# Patient Record
Sex: Male | Born: 1985 | Race: Black or African American | Hispanic: No | Marital: Married | Smoking: Never smoker
Health system: Southern US, Community
[De-identification: ages and names within clinical notes are randomized; demographics above are authoritative.]

---

## 2018-04-10 ENCOUNTER — Ambulatory Visit (HOSPITAL_COMMUNITY)
Admission: EM | Admit: 2018-04-10 | Discharge: 2018-04-10 | Disposition: A | Discharge status: Home / Self Care | Payer: Managed Care, Other (non HMO) | Attending: Family Medicine | Admitting: Family Medicine

## 2018-04-10 ENCOUNTER — Other Ambulatory Visit: Payer: Self-pay

## 2018-04-10 ENCOUNTER — Encounter (HOSPITAL_COMMUNITY): Payer: Self-pay | Admitting: *Deleted

## 2018-04-10 DIAGNOSIS — B349 Viral infection, unspecified: Secondary | ICD-10-CM | POA: Diagnosis not present

## 2018-04-10 MED ORDER — ONDANSETRON 4 MG PO TBDP: 4.0000 mg | ORAL_TABLET | Freq: Three times a day (TID) | ORAL | 0 refills | Status: AC | PRN

## 2018-04-10 MED ORDER — DIPHENHYDRAMINE HCL 25 MG PO TABS: 50.0000 mg | ORAL_TABLET | Freq: Every evening | ORAL | 0 refills | Status: AC | PRN

## 2018-04-10 NOTE — Discharge Instructions (Addendum)
No alarming signs on exam. Zofran as needed for nausea/vomiting. Benadryl as night to help with sleeping. You can take ibuprofen 800mg  before bedtime to prevent fever. Keep hydrated, your urine should be clear to pale yellow in color. Monitor for any worsening of symptoms, chest pain, shortness of breath, wheezing, swelling of the throat, night sweats, coughing up blood/vomiting up blood go to the emergency department for further evaluation needed.

## 2018-04-10 NOTE — ED Triage Notes (Signed)
C/O fever and cough x 1 wk; states fever has started to resolved, but still not feeling well.

## 2018-04-10 NOTE — ED Provider Notes (Signed)
[facility]    CSN: [id number] Arrival date & time: 04/10/18  1640     History   Chief Complaint Chief Complaint  Patient presents with  . Fever  . Cough    HPI Brian Oconnell is a 32 y.o. male.   32 year old male comes in for 1 week history of URI symptoms.  Has had cough, rhinorrhea, nasal congestion, body aches. Has had subjective fever without chills or night sweats.  Has had nausea with few episodes of vomiting.  States symptoms are improving, but still not feeling well.  Denies abdominal pain.  Has mild diarrhea.  Denies hemoptysis, hematemesis, melena, hematochezia.  OTC ibuprofen.  Never smoker.  States had gone to [place], returned 1 month ago.  Did take prophylactic treatment for malaria. States he is not able to sleep at night, but nothing particular is bothering him that causes this.      History reviewed. No pertinent past medical history.  There are no active problems to display for this patient.   History reviewed. No pertinent surgical history.     Home Medications    Prior to Admission medications   Medication Sig Start Date End Date Taking? Authorizing Provider  IBUPROFEN PO Take by mouth.   Yes [provider]  diphenhydrAMINE (BENADRYL) 25 MG tablet Take 2 tablets (50 mg total) by mouth at bedtime as needed for sleep. 04/10/18   Cathie HoopsYu, Amy V, PA-C  ondansetron (ZOFRAN ODT) 4 MG disintegrating tablet Take 1 tablet (4 mg total) by mouth every 8 (eight) hours as needed for nausea or vomiting. 04/10/18   Belinda FisherYu, Amy V, PA-C    Family History History reviewed. No pertinent family history.  Social History Social History   Tobacco Use  . Smoking status: Never Smoker  . Smokeless tobacco: Never Used  Substance Use Topics  . Alcohol use: Yes    Comment: 40oz beer daily  . Drug use: Never     Allergies   Patient has no known allergies.   Review of Systems Review of Systems  Reason unable to perform ROS: See HPI as above.      Physical Exam Triage Vital Signs ED Triage Vitals  Enc Vitals Group     BP 04/10/18 1724 135/76     Pulse Rate 04/10/18 1724 100     Resp 04/10/18 1724 18     Temp 04/10/18 1724 97.6 F (36.4 C)     Temp Source 04/10/18 1724 Oral     SpO2 04/10/18 1724 100 %     Weight --      Height --      Head Circumference --      Peak Flow --      Pain Score 04/10/18 1726 5     Pain Loc --      Pain Edu? --      Excl. in GC? --    No data found.  Updated Vital Signs BP 135/76   Pulse 100   Temp 97.6 F (36.4 C) (Oral)   Resp 18   SpO2 100%   Physical Exam  Constitutional: He is oriented to person, place, and time. He appears well-developed and well-nourished. No distress.  HENT:  Head: Normocephalic and atraumatic.  Right Ear: Tympanic membrane, external ear and ear canal normal. Tympanic membrane is not erythematous and not bulging.  Left Ear: Tympanic membrane, external ear and ear canal normal. Tympanic membrane is not erythematous and not bulging.  Nose: Nose normal. Right sinus  exhibits no maxillary sinus tenderness and no frontal sinus tenderness. Left sinus exhibits no maxillary sinus tenderness and no frontal sinus tenderness.  Mouth/Throat: Uvula is midline, oropharynx is clear and moist and mucous membranes are normal.  Eyes: Pupils are equal, round, and reactive to light. Conjunctivae are normal.  Neck: Normal range of motion. Neck supple.  Cardiovascular: Normal rate, regular rhythm and normal heart sounds. Exam reveals no gallop and no friction rub.  No murmur heard. Pulmonary/Chest: Effort normal and breath sounds normal. He has no decreased breath sounds. He has no wheezes. He has no rhonchi. He has no rales.  Abdominal: Soft. Bowel sounds are normal. There is no tenderness. There is no rebound and no guarding.  Lymphadenopathy:    He has no cervical adenopathy.  Neurological: He is alert and oriented to person, place, and time.  Skin: Skin is warm and dry.   Psychiatric: He has a normal mood and affect. His behavior is normal. Judgment normal.     UC Treatments / Results  Labs (all labs ordered are listed, but only abnormal results are displayed) Labs Reviewed - No data to display  EKG None  Radiology No results found.  Procedures Procedures (including critical care time)  Medications Ordered in UC Medications - No data to display  Initial Impression / Assessment and Plan / UC Course  I have reviewed the triage vital signs and the nursing notes.  Pertinent labs & imaging results that were available during my care of the patient were reviewed by me and considered in my medical decision making (see chart for details).    Discussed case with Dr Milus Glazier. Improving URI symptoms, will treat as viral illness. Symptomatic treatment as needed. Push fluids. Return precautions given.   Final Clinical Impressions(s) / UC Diagnoses   Final diagnoses:  Viral illness    ED Prescriptions    Medication Sig Dispense Auth. Provider   ondansetron (ZOFRAN ODT) 4 MG disintegrating tablet Take 1 tablet (4 mg total) by mouth every 8 (eight) hours as needed for nausea or vomiting. 10 tablet Yu, Amy V, PA-C   diphenhydrAMINE (BENADRYL) 25 MG tablet Take 2 tablets (50 mg total) by mouth at bedtime as needed for sleep. 30 tablet Threasa Alpha, [place] 04/10/18 1610

## 2018-04-11 ENCOUNTER — Emergency Department (HOSPITAL_COMMUNITY): Payer: Managed Care, Other (non HMO)

## 2018-04-11 ENCOUNTER — Encounter (HOSPITAL_COMMUNITY): Payer: Self-pay | Admitting: Emergency Medicine

## 2018-04-11 ENCOUNTER — Observation Stay (HOSPITAL_COMMUNITY)
Admission: EM | Admit: 2018-04-11 | Discharge: 2018-04-12 | Disposition: A | Discharge status: Home / Self Care | Payer: Managed Care, Other (non HMO) | Attending: Internal Medicine | Admitting: Internal Medicine

## 2018-04-11 DIAGNOSIS — F1010 Alcohol abuse, uncomplicated: Secondary | ICD-10-CM | POA: Diagnosis present

## 2018-04-11 DIAGNOSIS — T783XXA Angioneurotic edema, initial encounter: Secondary | ICD-10-CM | POA: Insufficient documentation

## 2018-04-11 DIAGNOSIS — T39315A Adverse effect of propionic acid derivatives, initial encounter: Secondary | ICD-10-CM

## 2018-04-11 DIAGNOSIS — Z791 Long term (current) use of non-steroidal anti-inflammatories (NSAID): Secondary | ICD-10-CM | POA: Diagnosis not present

## 2018-04-11 DIAGNOSIS — Z23 Encounter for immunization: Secondary | ICD-10-CM | POA: Diagnosis not present

## 2018-04-11 DIAGNOSIS — D6959 Other secondary thrombocytopenia: Secondary | ICD-10-CM | POA: Insufficient documentation

## 2018-04-11 DIAGNOSIS — Z7901 Long term (current) use of anticoagulants: Secondary | ICD-10-CM | POA: Insufficient documentation

## 2018-04-11 DIAGNOSIS — X58XXXA Exposure to other specified factors, initial encounter: Secondary | ICD-10-CM | POA: Diagnosis not present

## 2018-04-11 DIAGNOSIS — T7840XA Allergy, unspecified, initial encounter: Secondary | ICD-10-CM

## 2018-04-11 DIAGNOSIS — Z7189 Other specified counseling: Secondary | ICD-10-CM | POA: Diagnosis not present

## 2018-04-11 DIAGNOSIS — R4182 Altered mental status, unspecified: Secondary | ICD-10-CM | POA: Diagnosis present

## 2018-04-11 DIAGNOSIS — Z889 Allergy status to unspecified drugs, medicaments and biological substances status: Secondary | ICD-10-CM

## 2018-04-11 DIAGNOSIS — Z886 Allergy status to analgesic agent status: Secondary | ICD-10-CM | POA: Insufficient documentation

## 2018-04-11 DIAGNOSIS — Z7289 Other problems related to lifestyle: Secondary | ICD-10-CM | POA: Insufficient documentation

## 2018-04-11 DIAGNOSIS — D649 Anemia, unspecified: Secondary | ICD-10-CM | POA: Diagnosis not present

## 2018-04-11 DIAGNOSIS — B54 Unspecified malaria: Secondary | ICD-10-CM | POA: Diagnosis not present

## 2018-04-11 LAB — CBC WITH DIFFERENTIAL/PLATELET
BAND NEUTROPHILS: 0 %
BASOS PCT: 0 %
Band Neutrophils: 0 %
Basophils Absolute: 0 10*3/uL (ref 0.0–0.1)
Basophils Absolute: 0 10*3/uL (ref 0.0–0.1)
Basophils Relative: 0 %
Blasts: 0 %
Blasts: 0 %
EOS ABS: 0 10*3/uL (ref 0.0–0.5)
EOS PCT: 0 %
Eosinophils Absolute: 0 10*3/uL (ref 0.0–0.5)
Eosinophils Relative: 0 %
HCT: 39 % (ref 39.0–52.0)
HEMATOCRIT: 35.6 % — AB (ref 39.0–52.0)
HEMOGLOBIN: 11.8 g/dL — AB (ref 13.0–17.0)
Hemoglobin: 12.5 g/dL — ABNORMAL LOW (ref 13.0–17.0)
LYMPHS ABS: 1.5 10*3/uL (ref 0.7–4.0)
LYMPHS PCT: 18 %
LYMPHS PCT: 21 %
Lymphs Abs: 1.7 10*3/uL (ref 0.7–4.0)
MCH: 26.3 pg (ref 26.0–34.0)
MCH: 26.8 pg (ref 26.0–34.0)
MCHC: 32.1 g/dL (ref 30.0–36.0)
MCHC: 33.1 g/dL (ref 30.0–36.0)
MCV: 82.1 fL (ref 80.0–100.0)
MCV: 80.9 fL (ref 80.0–100.0)
MONO ABS: 1.6 10*3/uL — AB (ref 0.1–1.0)
MONOS PCT: 19 %
Metamyelocytes Relative: 0 %
Metamyelocytes Relative: 0 %
Monocytes Absolute: 1.4 10*3/uL — ABNORMAL HIGH (ref 0.1–1.0)
Monocytes Relative: 17 %
Myelocytes: 0 %
Myelocytes: 0 %
NEUTROS PCT: 62 %
NRBC: 0 /100{WBCs}
Neutro Abs: 5.4 10*3/uL (ref 1.7–7.7)
Neutro Abs: 5.1 10*3/uL (ref 1.7–7.7)
Neutrophils Relative %: 63 %
OTHER: 0 %
OTHER: 0 %
PLATELETS: UNDETERMINED 10*3/uL (ref 150–400)
PROMYELOCYTES RELATIVE: 0 %
Platelets: DECREASED 10*3/uL (ref 150–400)
Promyelocytes Relative: 0 %
RBC: 4.75 MIL/uL (ref 4.22–5.81)
RBC: 4.4 MIL/uL (ref 4.22–5.81)
RDW: 14.5 % (ref 11.5–15.5)
RDW: 14.4 % (ref 11.5–15.5)
WBC: 8.5 10*3/uL (ref 4.0–10.5)
WBC: 8.2 10*3/uL (ref 4.0–10.5)
nRBC: 0 % (ref 0.0–0.2)
nRBC: 0 /100 WBC

## 2018-04-11 LAB — COMPREHENSIVE METABOLIC PANEL
ALK PHOS: 39 U/L (ref 38–126)
ALT: 21 U/L (ref 0–44)
ALT: 18 U/L (ref 0–44)
ANION GAP: 13 (ref 5–15)
ANION GAP: 10 (ref 5–15)
AST: 46 U/L — ABNORMAL HIGH (ref 15–41)
AST: 34 U/L (ref 15–41)
Albumin: 3.1 g/dL — ABNORMAL LOW (ref 3.5–5.0)
Albumin: 2.7 g/dL — ABNORMAL LOW (ref 3.5–5.0)
Alkaline Phosphatase: 41 U/L (ref 38–126)
BILIRUBIN TOTAL: 2.1 mg/dL — AB (ref 0.3–1.2)
BUN: 24 mg/dL — ABNORMAL HIGH (ref 6–20)
BUN: 26 mg/dL — AB (ref 6–20)
CALCIUM: 8.4 mg/dL — AB (ref 8.9–10.3)
CHLORIDE: 98 mmol/L (ref 98–111)
CO2: 23 mmol/L (ref 22–32)
CO2: 24 mmol/L (ref 22–32)
CREATININE: 0.97 mg/dL (ref 0.61–1.24)
Calcium: 8.8 mg/dL — ABNORMAL LOW (ref 8.9–10.3)
Chloride: 101 mmol/L (ref 98–111)
Creatinine, Ser: 1.22 mg/dL (ref 0.61–1.24)
GFR calc Af Amer: >60 mL/min (ref >60)
GFR calc non Af Amer: >60 mL/min (ref >60)
GFR calc non Af Amer: >60 mL/min (ref >60)
GLUCOSE: 84 mg/dL (ref 70–99)
Glucose, Bld: 104 mg/dL — ABNORMAL HIGH (ref 70–99)
POTASSIUM: 3.5 mmol/L (ref 3.5–5.1)
Potassium: 3.6 mmol/L (ref 3.5–5.1)
SODIUM: 134 mmol/L — AB (ref 135–145)
Sodium: 135 mmol/L (ref 135–145)
TOTAL PROTEIN: 7 g/dL (ref 6.5–8.1)
Total Bilirubin: 3.2 mg/dL — ABNORMAL HIGH (ref 0.3–1.2)
Total Protein: 7.7 g/dL (ref 6.5–8.1)

## 2018-04-11 LAB — RAPID URINE DRUG SCREEN, HOSP PERFORMED
Amphetamines: NOT DETECTED
Barbiturates: NOT DETECTED
Benzodiazepines: NOT DETECTED
COCAINE: NOT DETECTED
OPIATES: NOT DETECTED
Tetrahydrocannabinol: POSITIVE — AB

## 2018-04-11 LAB — URINALYSIS, ROUTINE W REFLEX MICROSCOPIC
BILIRUBIN URINE: NEGATIVE
Glucose, UA: NEGATIVE mg/dL
Ketones, ur: 5 mg/dL — AB
Leukocytes, UA: NEGATIVE
Nitrite: NEGATIVE
PROTEIN: 100 mg/dL — AB
SPECIFIC GRAVITY, URINE: 1.029 (ref 1.005–1.030)
pH: 5 (ref 5.0–8.0)

## 2018-04-11 LAB — ETHANOL: Alcohol, Ethyl (B): <10 mg/dL (ref <10)

## 2018-04-11 LAB — PARASITE EXAM SCREEN, BLOOD-W CONF TO LABCORP (NOT @ ARMC)

## 2018-04-11 LAB — RAPID HIV SCREEN (HIV 1/2 AB+AG)
HIV 1/2 ANTIBODIES: NONREACTIVE
HIV-1 P24 ANTIGEN - HIV24: NONREACTIVE

## 2018-04-11 LAB — I-STAT CG4 LACTIC ACID, ED
Lactic Acid, Venous: 1.77 mmol/L (ref 0.5–1.9)
Lactic Acid, Venous: 1.25 mmol/L (ref 0.5–1.9)

## 2018-04-11 MED ORDER — SODIUM CHLORIDE 0.9 % IV BOLUS
1000.0000 mL | Freq: Once | INTRAVENOUS | Status: AC
  Administered 2018-04-11: 1000 mL via INTRAVENOUS

## 2018-04-11 MED ORDER — BISACODYL 5 MG PO TBEC: 5.0000 mg | DELAYED_RELEASE_TABLET | Freq: Every day | ORAL | Status: DC | PRN

## 2018-04-11 MED ORDER — THIAMINE HCL 100 MG/ML IJ SOLN
100.0000 mg | Freq: Every day | INTRAMUSCULAR | Status: DC
  Administered 2018-04-11 – 2018-04-12 (×2): 100 mg via INTRAVENOUS
  Filled 2018-04-11 (×2): qty 2

## 2018-04-11 MED ORDER — DIPHENHYDRAMINE HCL 50 MG/ML IJ SOLN
12.5000 mg | Freq: Once | INTRAMUSCULAR | Status: AC
  Administered 2018-04-11: 12.5 mg via INTRAVENOUS
  Filled 2018-04-11: qty 1

## 2018-04-11 MED ORDER — SODIUM CHLORIDE 0.9% FLUSH
3.0000 mL | Freq: Two times a day (BID) | INTRAVENOUS | Status: DC
  Administered 2018-04-11 – 2018-04-12 (×2): 3 mL via INTRAVENOUS

## 2018-04-11 MED ORDER — ACETAMINOPHEN 325 MG PO TABS: 650.0000 mg | ORAL_TABLET | Freq: Four times a day (QID) | ORAL | Status: DC | PRN

## 2018-04-11 MED ORDER — LACTATED RINGERS IV SOLN
INTRAVENOUS | Status: DC
  Administered 2018-04-11 – 2018-04-12 (×3): via INTRAVENOUS

## 2018-04-11 MED ORDER — ARTEMETHER-LUMEFANTRINE 20-120 MG PO TABS
4.0000 | ORAL_TABLET | Freq: Once | ORAL | Status: AC
  Administered 2018-04-11: 4 via ORAL
  Filled 2018-04-11 (×3): qty 24

## 2018-04-11 MED ORDER — ARTEMETHER-LUMEFANTRINE 20-120 MG PO TABS
4.0000 | ORAL_TABLET | Freq: Once | ORAL | Status: AC
  Administered 2018-04-11: 4 via ORAL
  Filled 2018-04-11: qty 24

## 2018-04-11 MED ORDER — ARTEMETHER-LUMEFANTRINE 20-120 MG PO TABS: 4.0000 | ORAL_TABLET | Freq: Two times a day (BID) | ORAL | Status: DC

## 2018-04-11 MED ORDER — METHYLPREDNISOLONE SODIUM SUCC 125 MG IJ SOLR
60.0000 mg | Freq: Once | INTRAMUSCULAR | Status: AC
  Administered 2018-04-11: 60 mg via INTRAVENOUS
  Filled 2018-04-11: qty 2

## 2018-04-11 MED ORDER — CHLORDIAZEPOXIDE HCL 5 MG PO CAPS
10.0000 mg | ORAL_CAPSULE | Freq: Three times a day (TID) | ORAL | Status: DC
  Administered 2018-04-11 – 2018-04-12 (×3): 10 mg via ORAL
  Filled 2018-04-11 (×3): qty 2

## 2018-04-11 MED ORDER — FOLIC ACID 1 MG PO TABS
1.0000 mg | ORAL_TABLET | Freq: Every day | ORAL | Status: DC
  Administered 2018-04-11 – 2018-04-12 (×2): 1 mg via ORAL
  Filled 2018-04-11 (×2): qty 1

## 2018-04-11 MED ORDER — LORAZEPAM 2 MG/ML IJ SOLN: 1.0000 mg | INTRAMUSCULAR | Status: DC | PRN

## 2018-04-11 MED ORDER — INFLUENZA VAC SPLIT QUAD 0.5 ML IM SUSY
0.5000 mL | PREFILLED_SYRINGE | INTRAMUSCULAR | Status: AC
  Administered 2018-04-12: 0.5 mL via INTRAMUSCULAR
  Filled 2018-04-11: qty 0.5

## 2018-04-11 MED ORDER — ONDANSETRON HCL 4 MG/2ML IJ SOLN: 4.0000 mg | Freq: Once | INTRAMUSCULAR | Status: DC

## 2018-04-11 MED ORDER — ACETAMINOPHEN 650 MG RE SUPP: 650.0000 mg | Freq: Four times a day (QID) | RECTAL | Status: DC | PRN

## 2018-04-11 MED ORDER — HEPARIN SODIUM (PORCINE) 5000 UNIT/ML IJ SOLN
5000.0000 [IU] | Freq: Three times a day (TID) | INTRAMUSCULAR | Status: DC
  Administered 2018-04-11 – 2018-04-12 (×2): 5000 [IU] via SUBCUTANEOUS
  Filled 2018-04-11 (×4): qty 1

## 2018-04-11 NOTE — Consult Note (Addendum)
[facility]  Total days of antibiotics        Reason for Consult: malaria   Referring Physician: ED  Active Problems:   * No active hospital problems. *    HPI: Brian Oconnell is a 32 y.o. male  Who is originally from [place], now resides in [place] for the last 5 years. He states that he has felt poorly, with night-time fevers for the past 7 days. He has recent returned from [place] roughly 4 wks ago visiting family. He states that he took anti-malarials that he acquired in [place] while he was there, but did not take any further medication upon returning to the states. He started to feel poorly with fevers roughly a week ago. He has been taking ibuprofen for discomfort. He went to Urgent care for evaluation on 11/24 and was dx with viral illness, due to complaint of fever, nausea, and cough. Today, EMS was brought in since his friend had found him unable to talk for unclear reason. In the ED, found to have some element of swollen tongue, unable to swallow saliva, thought to have drug reaction to ibuprofen- possible angioedema. His blood work was remarkable for hgb 12.5, plt were clumped but differential showed ring forms - roughly 2-3 rings/HPF per my inspection of smear. Cr mildly elevated at 1.25, and Tbili of 3.3. He is being admitted for monitoring/management of malaria and angioedema.  History reviewed. No pertinent past medical history.  Allergies: No Known Allergies  MEDICATIONS: . artemether-lumefantrine  4 tablet Oral Once  . artemether-lumefantrine  4 tablet Oral Once  . [START ON 04/12/2018] artemether-lumefantrine  4 tablet Oral BID    Social History   Tobacco Use  . Smoking status: Never Smoker  . Smokeless tobacco: Never Used  Substance Use Topics  . Alcohol use: Yes    Comment: 40oz beer daily  . Drug use: Never    Family history - family is in good health. Has sister in [place] and brother in [place], [employer] student Review of Systems  -   Constitutional: +for fever, chills, diaphoresis, activity change, appetite change, fatigue and unexpected weight change.  HENT: Negative for congestion, sore throat, rhinorrhea, sneezing, trouble swallowing and sinus pressure.  Eyes: Negative for photophobia and visual disturbance.  Respiratory: Negative for cough, chest tightness, shortness of breath, wheezing and stridor.  Cardiovascular: Negative for chest pain, palpitations and leg swelling.  Gastrointestinal: + for nausea, vomiting, abdominal pain, diarrhea, constipation, blood in stool, abdominal distention and anal bleeding.  Genitourinary: Negative for dysuria, hematuria, flank pain and difficulty urinating.  Musculoskeletal: Negative for myalgias, back pain, joint swelling, arthralgias and gait problem.  Skin: Negative for color change, pallor, rash and wound.  Neurological: Negative for dizziness, tremors, weakness and light-headedness.  Hematological: Negative for adenopathy. Does not bruise/bleed easily.  Psychiatric/Behavioral: Negative for behavioral problems, confusion, sleep disturbance, dysphoric mood, decreased concentration and agitation.     OBJECTIVE: Temp:  [97.6 F (36.4 C)-98.9 F (37.2 C)] 98.9 F (37.2 C) (11/25 1102) Pulse Rate:  [88-108] 88 (11/25 1200) Resp:  [18-25] 21 (11/25 1200) BP: (109-142)/(76-95) 109/86 (11/25 1200) SpO2:  [97 %-100 %] 99 % (11/25 1200) Physical Exam  Constitutional: He is oriented to person, place, and time. He appears well-developed and well-nourished. No distress.  HENT: mild scleral icterus Mouth/Throat: Oropharynx is clear and moist. No oropharyngeal exudate.  Cardiovascular: Normal rate, regular rhythm and normal heart sounds. Exam reveals no gallop and no friction rub.  No  murmur heard.  Pulmonary/Chest: Effort normal and breath sounds normal. No respiratory distress. He has no wheezes.  Abdominal: Soft. Bowel sounds are normal. He exhibits no distension. There is no  tenderness.  Lymphadenopathy:  He has no cervical adenopathy.  Neurological: He is alert and oriented to person, place, and time.  Skin: Skin is warm and dry. No rash noted. No erythema.  Psychiatric: initially difficult making eye contact. Tearful when he heard he had malaria. His behavior is normal.    LABS: Results for orders placed or performed during the hospital encounter of 04/11/18 (from the past 48 hour(s))  Comprehensive metabolic panel     Status: Abnormal   Collection Time: 04/11/18 11:35 AM  Result Value Ref Range   Sodium 134 (L) 135 - 145 mmol/L   Potassium 3.5 3.5 - 5.1 mmol/L   Chloride 98 98 - 111 mmol/L   CO2 23 22 - 32 mmol/L   Glucose, Bld 104 (H) 70 - 99 mg/dL   BUN 24 (H) 6 - 20 mg/dL   Creatinine, Ser 1.22 0.61 - 1.24 mg/dL   Calcium 8.8 (L) 8.9 - 10.3 mg/dL   Total Protein 7.7 6.5 - 8.1 g/dL   Albumin 3.1 (L) 3.5 - 5.0 g/dL   AST 46 (H) 15 - 41 U/L   ALT 21 0 - 44 U/L   Alkaline Phosphatase 41 38 - 126 U/L   Total Bilirubin 3.2 (H) 0.3 - 1.2 mg/dL   GFR calc non Af Amer >60 >60 mL/min   GFR calc Af Amer >60 >60 mL/min    Comment: (NOTE) The eGFR has been calculated using the CKD EPI equation. This calculation has not been validated in all clinical situations. eGFR's persistently <60 mL/min signify possible Chronic Kidney Disease.    Anion gap 13 5 - 15    Comment: Performed at [facility] [address], [place], [place] [address]  CBC with Differential     Status: Abnormal   Collection Time: 04/11/18 11:35 AM  Result Value Ref Range   WBC 8.5 4.0 - 10.5 K/uL   RBC 4.75 4.22 - 5.81 MIL/uL   Hemoglobin 12.5 (L) 13.0 - 17.0 g/dL   HCT 39.0 39.0 - 52.0 %   MCV 82.1 80.0 - 100.0 fL   MCH 26.3 26.0 - 34.0 pg   MCHC 32.1 30.0 - 36.0 g/dL   RDW 14.5 11.5 - 15.5 %   Platelets PLATELET CLUMPS NOTED ON SMEAR, UNABLE TO ESTIMATE 150 - 400 K/uL   Neutrophils Relative % 63 %   Lymphocytes Relative 18 %   Monocytes Relative 19 %   Eosinophils  Relative 0 %   Basophils Relative 0 %   Band Neutrophils 0 %   Metamyelocytes Relative 0 %   Myelocytes 0 %   Promyelocytes Relative 0 %   Blasts 0 %   nRBC 0 0 /100 WBC   Other 0 %   Neutro Abs 5.4 1.7 - 7.7 K/uL   Lymphs Abs 1.5 0.7 - 4.0 K/uL   Monocytes Absolute 1.6 (H) 0.1 - 1.0 K/uL   Eosinophils Absolute 0.0 0.0 - 0.5 K/uL   Basophils Absolute 0.0 0.0 - 0.1 K/uL   RBC Morphology MALARIA NOTED ON SMEAR     Comment: Preliminary result given to Olivia Canter PA 04/11/2018 1253 BY A BENNETT Performed at [facility], [place] [address] [place], [place] [address]   Ethanol     Status: None   Collection Time:  04/11/18 11:35 AM  Result Value Ref Range   Alcohol, Ethyl (B) <10 <10 mg/dL    Comment: (NOTE) Lowest detectable limit for serum alcohol is 10 mg/dL. For medical purposes only. Performed at [facility], [place] [address], [place], [place] [address]   I-Stat CG4 Lactic Acid, ED     Status: None   Collection Time: 04/11/18 11:41 AM  Result Value Ref Range   Lactic Acid, Venous 1.77 0.5 - 1.9 mmol/L    MICRO: reviewed IMAGING: Dg Chest 2 View  Result Date: 04/11/2018 CLINICAL DATA:  Cough for a week. EXAM: CHEST - 2 VIEW COMPARISON:  None. FINDINGS: Heart size and mediastinal contours are within normal limits. Lungs are clear. No pleural effusion or pneumothorax seen. Osseous structures about the chest are unremarkable. IMPRESSION: No active cardiopulmonary disease. No evidence of pneumonia or pulmonary edema. Electronically Signed   By: Franki Cabot M.D.   On: 04/11/2018 12:33   Ct Head Wo Contrast  Result Date: 04/11/2018 CLINICAL DATA:  Altered level of consciousness EXAM: CT HEAD WITHOUT CONTRAST TECHNIQUE: Contiguous axial images were obtained from the base of the skull through the vertex without intravenous contrast. COMPARISON:  None. FINDINGS: Brain: No evidence of acute infarction, hemorrhage, hydrocephalus, extra-axial collection or mass lesion/mass  effect. Vascular: Negative for hyperdense vessel Skull: Negative Sinuses/Orbits: Bubbly secretions right maxillary sinus. Remaining sinuses clear. Normal orbit. Other: None IMPRESSION: Negative CT brain Bubbly secretions right maxillary sinus. Electronically Signed   By: Franchot Gallo M.D.   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 Will contact CDC to see if can get artesunate, IV formulation of anti-malarials due to aki,6-7% parasitemia and AMS. - Please send thick and thin smears for evaluation and ID - will repeat cbc and cmp tonite to see if there is further hemolysis - Will need to be admitted for observation to monitor for any signs of worsening anemia or mental status changes - Please check hiv  Angioedema to nsaids = pt getting benadryl and dose of solumedrol.  If AMS = still persists tomorrow or his AMS worsens, would get mri to see if having infarcts associated with malaria  He reports his sister is taking a bus down from [place] to be with him.  Spent 110 min with patient >50% in coordination of care to treat malaria/access to medications

## 2018-04-11 NOTE — ED Notes (Signed)
Patient transported to CT 

## 2018-04-11 NOTE — ED Provider Notes (Signed)
[facility] EMERGENCY DEPARTMENT Provider Note   CSN: [id number] Arrival date & time: 04/11/18  1056     History   Chief Complaint Chief Complaint  Patient presents with  . Altered Mental Status    HPI Brian Oconnell is a 32 y.o. male.  31yo male brought in by EMS for evaluation. Patient is awake and alert, follows simple commands but will not talk at this time. Patient's friend on the phone states he called to check on him today and the patient answered the phone but would not talk which prompted the friend to call 911 for him. Friend states patient is from [place], [place], went back to [place] to visit and returned a month ago, patient has not felt well recently and went to UC last night. Review of UC note, patient reported he had 1 week of cough, runny nose, congestion, body aches, nausea/vomiting/diarrhea, subjective fever without chills or sweats. UC record reports patient took antimalarial drugs while out of the country. Unknown vaccine status.      History reviewed. No pertinent past medical history.  There are no active problems to display for this patient.   History reviewed. No pertinent surgical history.      Home Medications    Prior to Admission medications   Medication Sig Start Date End Date Taking? Authorizing Provider  diphenhydrAMINE (BENADRYL) 25 MG tablet Take 2 tablets (50 mg total) by mouth at bedtime as needed for sleep. 04/10/18   Cathie Hoops, Amy V, PA-C  IBUPROFEN PO Take by mouth.    [provider]  ondansetron (ZOFRAN ODT) 4 MG disintegrating tablet Take 1 tablet (4 mg total) by mouth every 8 (eight) hours as needed for nausea or vomiting. 04/10/18   Belinda Fisher, PA-C    Family History History reviewed. No pertinent family history.  Social History Social History   Tobacco Use  . Smoking status: Never Smoker  . Smokeless tobacco: Never Used  Substance Use Topics  . Alcohol use: Yes   Comment: 40oz beer daily  . Drug use: Never     Allergies   Patient has no known allergies.   Review of Systems Review of Systems  Unable to perform ROS: Patient nonverbal     Physical Exam Updated Vital Signs BP 109/86   Pulse 88   Temp 98.9 F (37.2 C) (Oral)   Resp (!) 21   SpO2 99%   Physical Exam  Constitutional: He appears well-developed and well-nourished. No distress.  HENT:  Head: Normocephalic and atraumatic.  Right Ear: External ear normal.  Left Ear: External ear normal.  Nose: Nose normal.  Mouth/Throat: Oropharynx is clear and moist and mucous membranes are normal. No trismus in the jaw. No uvula swelling. No oropharyngeal exudate. Tonsils are 2+ on the right. Tonsils are 1+ on the left. No tonsillar exudate.  Patient is hesitant to open mouth wide for exam, when given suction catheter he is able to fully open his mouth, oropharynx visualized with tongue depressor, no edema, no trismus  Eyes: Conjunctivae are normal.  Neck: Neck supple.  Cardiovascular: Regular rhythm, normal heart sounds and intact distal pulses. Tachycardia present.  No murmur heard. Pulmonary/Chest: Effort normal and breath sounds normal. No respiratory distress. He exhibits no tenderness.  Abdominal: Soft. There is no tenderness.  Musculoskeletal: He exhibits no tenderness.  Lymphadenopathy:    He has no cervical adenopathy.  Neurological: He is alert. GCS eye subscore is 4.  GCS verbal subscore is 1. GCS motor subscore is 6.  Alert, follows commands, non verbal  Skin: Skin is warm. No rash noted. He is diaphoretic.     ED Treatments / Results  Labs (all labs ordered are listed, but only abnormal results are displayed) Labs Reviewed  COMPREHENSIVE METABOLIC PANEL - Abnormal; Notable for the following components:      Result Value   Sodium 134 (*)    Glucose, Bld 104 (*)    BUN 24 (*)    Calcium 8.8 (*)    Albumin 3.1 (*)    AST 46 (*)    Total Bilirubin 3.2 (*)    All  other components within normal limits  CBC WITH DIFFERENTIAL/PLATELET - Abnormal; Notable for the following components:   Hemoglobin 12.5 (*)    Monocytes Absolute 1.6 (*)    All other components within normal limits  ETHANOL  URINALYSIS, ROUTINE W REFLEX MICROSCOPIC  RAPID URINE DRUG SCREEN, HOSP PERFORMED  PARASITE EXAM SCREEN, BLOOD-W CONF TO [facility] (NOT @ [facility])  RAPID HIV SCREEN (HIV 1/2 AB+AG)  I-STAT CG4 LACTIC ACID, ED  I-STAT CG4 LACTIC ACID, ED    EKG None  Radiology Dg Chest 2 View  Result Date: 04/11/2018 CLINICAL DATA:  Cough for a week. EXAM: CHEST - 2 VIEW COMPARISON:  None. FINDINGS: Heart size and mediastinal contours are within normal limits. Lungs are clear. No pleural effusion or pneumothorax seen. Osseous structures about the chest are unremarkable. IMPRESSION: No active cardiopulmonary disease. No evidence of pneumonia or pulmonary edema. Electronically Signed   By: Bary RichardStan  Maynard M.D.   On: 04/11/2018 12:33   Ct Head Wo Contrast  Result Date: 04/11/2018 CLINICAL DATA:  Altered level of consciousness EXAM: CT HEAD WITHOUT CONTRAST TECHNIQUE: Contiguous axial images were obtained from the base of the skull through the vertex without intravenous contrast. COMPARISON:  None. FINDINGS: Brain: No evidence of acute infarction, hemorrhage, hydrocephalus, extra-axial collection or mass lesion/mass effect. Vascular: Negative for hyperdense vessel Skull: Negative Sinuses/Orbits: Bubbly secretions right maxillary sinus. Remaining sinuses clear. Normal orbit. Other: None IMPRESSION: Negative CT brain Bubbly secretions right maxillary sinus. Electronically Signed   By: Marlan Palauharles  Clark M.D.   On: 04/11/2018 13:03    Procedures Procedures (including critical care time)  Medications Ordered in ED Medications  artemether-lumefantrine (COARTEM) 20-120 MG tablet 4 tablet (has no administration in time range)  artemether-lumefantrine (COARTEM) 20-120 MG tablet 4 tablet (has no  administration in time range)  ondansetron (ZOFRAN) injection 4 mg (has no administration in time range)  sodium chloride 0.9 % bolus 1,000 mL (0 mLs Intravenous Stopped 04/11/18 1307)  artemether-lumefantrine (COARTEM) 20-120 MG tablet 4 tablet (4 tablets Oral Given 04/11/18 1429)     Initial Impression / Assessment and Plan / ED Course  I have reviewed the triage vital signs and the nursing notes.  Pertinent labs & imaging results that were available during my care of the patient were reviewed by me and considered in my medical decision making (see chart for details).  Clinical Course as of Apr 12 1547  Mon Apr 11, 2018  1305 31yo male brought in by EMS from home, per UC note- patient seen last night for flu like illness x 1 week, returned from [place] 1 month ago and had reported taking antimalaria meds. Patient non verbal today, alert, smiles and nods his head, does not answer questions but does follow commands. Patient holding emesis bag, hesitant to open his mouth initially but able to  with suction and tongue depressor exam. Patient's friend on speaker phone, states patient does speak and understand [place]. Call from lab, ring form blood parasites, requests blood parasite screen. Case discussed with Dr. Drue Second, Infectious Disease, will see the patient.    [LM]  1523 Dr. Drue Second, Id, has seen the patient, requests hospitalist admit, 24 hour observation may be appropriate.    [LM]  1548 Discussed with hospitalist who will see the patient.   [LM]    Clinical Course User Index [LM] Jeannie Fend, PA-C   Final Clinical Impressions(s) / ED Diagnoses   Final diagnoses:  Malaria    ED Discharge Orders    None       Alden Hipp 04/11/18 1548    Loren Racer, MD 04/12/18 2258

## 2018-04-11 NOTE — ED Triage Notes (Signed)
Pt here from home with c/o sore throat , not talking , pt was seen yesterday and given scripts for a viral illness

## 2018-04-11 NOTE — H&P (Addendum)
TRH H&P   Patient Demographics:    Brian Oconnell, is a 32 y.o. male  MRN: [id number]   DOB - 12-12-1985  Admit Date - 04/11/2018  Outpatient Primary MD for the patient is Patient, No Pcp Per      Patient coming from: Home  Chief Complaint  Patient presents with  . Altered Mental Status      HPI:    Brian Oconnell  is a 32 y.o. male, who was originally from [place], medical history except alcohol consumption which is pretty heavy last drink 7 days ago, patient recently visited [place] about 5 weeks ago, came back and was feeling well however up for the last 7 to 8 days he has been not feeling well with mild generalized body aches, generalized weakness, he has not had any alcohol in 7 days, went to an urgent care where he was given ibuprofen and Benadryl, he was taking plenty of ibuprofen to feel better however for the last 14 to 15 hours he started experiencing some tongue swelling and was unable to talk properly.    He then came to the ER where work-up suggested that he possibly had a malarial infection with mild allergic reaction to NSAIDs causing early angioedema.  He had thankfully taken some Benadryl and his swelling is already going down.  He was seen by ID and I was requested to admit for further malaria treatment.    Review of systems:    A full 10 point Review of Systems was done, except as stated above, all other Review of Systems were negative.   With Past History of the following :    History reviewed. No pertinent past medical history.    History reviewed. No pertinent surgical history.    Social History:     Social History   Tobacco Use  . Smoking status: Never Smoker  . Smokeless  tobacco: Never Used  Substance Use Topics  . Alcohol use: Yes    Comment: 40oz beer daily         Family History :   History of bone marrow issues or problems.   Home Medications:   Prior to Admission medications   Medication Sig Start Date End Date Taking? Authorizing Provider  diphenhydrAMINE (BENADRYL) 25 MG tablet Take 2 tablets (50 mg total) by mouth at bedtime as  needed for sleep. 04/10/18   Cathie HoopsYu, Amy V, PA-C  IBUPROFEN PO Take by mouth.    [provider]  ondansetron (ZOFRAN ODT) 4 MG disintegrating tablet Take 1 tablet (4 mg total) by mouth every 8 (eight) hours as needed for nausea or vomiting. 04/10/18   Belinda FisherYu, Amy V, PA-C     Allergies:    No Known Allergies   Physical Exam:   Vitals  Blood pressure 109/86, pulse 88, temperature 98.9 F (37.2 C), temperature source Oral, resp. rate (!) 21, SpO2 99 %.   1. General Young African-American male lying in hospital bed in no apparent discomfort,  2. Normal affect and insight, Not Suicidal or Homicidal, Awake Alert, Oriented X 3.  3. No F.N deficits, ALL C.Nerves Intact, Strength 5/5 all 4 extremities, Sensation intact all 4 extremities, Plantars down going.  4. Ears and Eyes appear Normal, Conjunctivae clear, PERRLA. Moist Oral Mucosa.  Tongue size now appears to be normal,  5. Supple Neck, No JVD, No cervical lymphadenopathy appriciated, No Carotid Bruits.  6. Symmetrical Chest wall movement, Good air movement bilaterally, CTAB.  7. RRR, No Gallops, Rubs or Murmurs, No Parasternal Heave.  8. Positive Bowel Sounds, Abdomen Soft, No tenderness, No organomegaly appriciated,No rebound -guarding or rigidity.  9.  No Cyanosis, Normal Skin Turgor, No Skin Rash or Bruise.  10. Good muscle tone,  joints appear normal , no effusions, Normal ROM.  11. No Palpable Lymph Nodes in Neck or Axillae     Data Review:    CBC Recent Labs  Lab 04/11/18 1135  WBC 8.5  HGB 12.5*  HCT 39.0  PLT PLATELET CLUMPS  NOTED ON SMEAR, UNABLE TO ESTIMATE  MCV 82.1  MCH 26.3  MCHC 32.1  RDW 14.5  LYMPHSABS 1.5  MONOABS 1.6*  EOSABS 0.0  BASOSABS 0.0   ------------------------------------------------------------------------------------------------------------------  Chemistries  Recent Labs  Lab 04/11/18 1135  NA 134*  K 3.5  CL 98  CO2 23  GLUCOSE 104*  BUN 24*  CREATININE 1.22  CALCIUM 8.8*  AST 46*  ALT 21  ALKPHOS 41  BILITOT 3.2*   ------------------------------------------------------------------------------------------------------------------ CrCl cannot be calculated (Unknown ideal weight.). ------------------------------------------------------------------------------------------------------------------ No results for input(s): TSH, T4TOTAL, T3FREE, THYROIDAB in the last 72 hours.  Invalid input(s): FREET3  Coagulation profile No results for input(s): INR, PROTIME in the last 168 hours. ------------------------------------------------------------------------------------------------------------------- No results for input(s): DDIMER in the last 72 hours. -------------------------------------------------------------------------------------------------------------------  Cardiac Enzymes No results for input(s): CKMB, TROPONINI, MYOGLOBIN in the last 168 hours.  Invalid input(s): CK ------------------------------------------------------------------------------------------------------------------ No results found for: BNP   ---------------------------------------------------------------------------------------------------------------  Urinalysis No results found for: COLORURINE, APPEARANCEUR, LABSPEC, PHURINE, GLUCOSEU, HGBUR, BILIRUBINUR, KETONESUR, PROTEINUR, UROBILINOGEN, NITRITE, LEUKOCYTESUR  ----------------------------------------------------------------------------------------------------------------   Imaging Results:    Dg Chest 2 View  Result Date:  04/11/2018 CLINICAL DATA:  Cough for a week. EXAM: CHEST - 2 VIEW COMPARISON:  None. FINDINGS: Heart size and mediastinal contours are within normal limits. Lungs are clear. No pleural effusion or pneumothorax seen. Osseous structures about the chest are unremarkable. IMPRESSION: No active cardiopulmonary disease. No evidence of pneumonia or pulmonary edema. Electronically Signed   By: Bary RichardStan  Maynard M.D.   On: 04/11/2018 12:33   Ct Head Wo Contrast  Result Date: 04/11/2018 CLINICAL DATA:  Altered level of consciousness EXAM: CT HEAD WITHOUT CONTRAST TECHNIQUE: Contiguous axial images were obtained from the base of the skull through the vertex without intravenous contrast. COMPARISON:  None. FINDINGS: Brain: No evidence of acute infarction, hemorrhage, hydrocephalus, extra-axial  collection or mass lesion/mass effect. Vascular: Negative for hyperdense vessel Skull: Negative Sinuses/Orbits: Bubbly secretions right maxillary sinus. Remaining sinuses clear. Normal orbit. Other: None IMPRESSION: Negative CT brain Bubbly secretions right maxillary sinus. Electronically Signed   By: Marlan Palau M.D.   On: 04/11/2018 13:03        Assessment & Plan:      1.  Acute malaria suspicious for falciparum.  Seen by ID, will be placed on Coartem, IVF for hydration monitor CBC, use Tylenol as needed avoid NSAIDs for possible allergy.  2.  Angioedema likely allergic to NSAIDs.  Much improved and almost resolved, 1 dose of IV Benadryl and Solu-Medrol, monitor with supportive care.  3.  Counseled to quit, last drink 7 days ago, placed on folic acid and thiamine, low-dose scheduled Librium.  Monitor on IV Ativan as needed.  Should not go in DTs as last drink was 7 days ago.     DVT Prophylaxis Heparin    AM Labs Ordered, also please review Full Orders  Family Communication: Admission, patients condition and plan of care including tests being ordered have been discussed with the patient who indicates  understanding and agree with the plan and Code Status.  Code Status Full  Likely DC to  Home 1-2 days  Condition GUARDED    Consults called: ID    Admission status: Inpt    Time spent in minutes : 35   Susa Raring M.D on 04/11/2018 at 4:03 PM  To page go to [email or web address] - password [facility]

## 2018-04-12 ENCOUNTER — Other Ambulatory Visit: Payer: Self-pay

## 2018-04-12 DIAGNOSIS — N189 Chronic kidney disease, unspecified: Secondary | ICD-10-CM | POA: Diagnosis not present

## 2018-04-12 DIAGNOSIS — F1010 Alcohol abuse, uncomplicated: Secondary | ICD-10-CM | POA: Diagnosis present

## 2018-04-12 DIAGNOSIS — D649 Anemia, unspecified: Secondary | ICD-10-CM | POA: Diagnosis present

## 2018-04-12 DIAGNOSIS — T39315D Adverse effect of propionic acid derivatives, subsequent encounter: Secondary | ICD-10-CM | POA: Diagnosis not present

## 2018-04-12 DIAGNOSIS — B509 Plasmodium falciparum malaria, unspecified: Secondary | ICD-10-CM | POA: Diagnosis not present

## 2018-04-12 DIAGNOSIS — D631 Anemia in chronic kidney disease: Secondary | ICD-10-CM

## 2018-04-12 DIAGNOSIS — Z889 Allergy status to unspecified drugs, medicaments and biological substances status: Secondary | ICD-10-CM | POA: Diagnosis not present

## 2018-04-12 DIAGNOSIS — T783XXD Angioneurotic edema, subsequent encounter: Secondary | ICD-10-CM

## 2018-04-12 DIAGNOSIS — N179 Acute kidney failure, unspecified: Secondary | ICD-10-CM

## 2018-04-12 DIAGNOSIS — B54 Unspecified malaria: Secondary | ICD-10-CM | POA: Diagnosis not present

## 2018-04-12 LAB — CBC
HEMATOCRIT: 32 % — AB (ref 39.0–52.0)
Hemoglobin: 10.4 g/dL — ABNORMAL LOW (ref 13.0–17.0)
MCH: 26.5 pg (ref 26.0–34.0)
MCHC: 32.5 g/dL (ref 30.0–36.0)
MCV: 81.6 fL (ref 80.0–100.0)
Platelets: 92 10*3/uL — ABNORMAL LOW (ref 150–400)
RBC: 3.92 MIL/uL — ABNORMAL LOW (ref 4.22–5.81)
RDW: 14.7 % (ref 11.5–15.5)
WBC: 5.5 10*3/uL (ref 4.0–10.5)
nRBC: 0 % (ref 0.0–0.2)

## 2018-04-12 LAB — TECHNOLOGIST SMEAR REVIEW: Tech Review: 34

## 2018-04-12 LAB — COMPREHENSIVE METABOLIC PANEL
ALK PHOS: 33 U/L — AB (ref 38–126)
ALT: 18 U/L (ref 0–44)
AST: 31 U/L (ref 15–41)
Albumin: 2.5 g/dL — ABNORMAL LOW (ref 3.5–5.0)
Anion gap: 8 (ref 5–15)
BILIRUBIN TOTAL: 1.3 mg/dL — AB (ref 0.3–1.2)
BUN: 26 mg/dL — AB (ref 6–20)
CO2: 25 mmol/L (ref 22–32)
Calcium: 8.6 mg/dL — ABNORMAL LOW (ref 8.9–10.3)
Chloride: 103 mmol/L (ref 98–111)
Creatinine, Ser: 0.94 mg/dL (ref 0.61–1.24)
GFR calc Af Amer: >60 mL/min (ref >60)
Glucose, Bld: 106 mg/dL — ABNORMAL HIGH (ref 70–99)
Potassium: 4.3 mmol/L (ref 3.5–5.1)
Sodium: 136 mmol/L (ref 135–145)
Total Protein: 6.4 g/dL — ABNORMAL LOW (ref 6.5–8.1)

## 2018-04-12 LAB — PATHOLOGIST SMEAR REVIEW

## 2018-04-12 LAB — MAGNESIUM: MAGNESIUM: 2 mg/dL (ref 1.7–2.4)

## 2018-04-12 MED ORDER — ATOVAQUONE-PROGUANIL HCL 250-100 MG PO TABS: 4.0000 | ORAL_TABLET | Freq: Every day | ORAL | 0 refills | Status: AC

## 2018-04-12 MED ORDER — ARTESUNATE 10 MG/ML IJ SOLN - FOR COMPASSIONATE USE (CDC SUPPLY)
2.4000 mg/kg | Freq: Two times a day (BID) | INTRAMUSCULAR | Status: DC
  Filled 2018-04-12 (×2): qty 17.4

## 2018-04-12 MED ORDER — VITAMIN B-1 100 MG PO TABS: 100.0000 mg | ORAL_TABLET | Freq: Every day | ORAL | Status: DC

## 2018-04-12 MED ORDER — ARTESUNATE 10 MG/ML IJ SOLN - FOR COMPASSIONATE USE (CDC SUPPLY): 180.0000 mg | Freq: Once | INTRAMUSCULAR | Status: DC

## 2018-04-12 MED ORDER — ARTEMETHER-LUMEFANTRINE 20-120 MG PO TABS: 4.0000 | ORAL_TABLET | Freq: Two times a day (BID) | ORAL | 0 refills | Status: DC

## 2018-04-12 MED ORDER — ARTESUNATE 10 MG/ML IJ SOLN - FOR COMPASSIONATE USE (CDC SUPPLY): 2.4000 mg/kg | Freq: Once | INTRAMUSCULAR | Status: DC

## 2018-04-12 MED ORDER — ARTESUNATE 10 MG/ML IJ SOLN - FOR COMPASSIONATE USE (CDC SUPPLY)
180.0000 mg | Freq: Two times a day (BID) | INTRAMUSCULAR | Status: DC
  Administered 2018-04-12 (×2): 180 mg via INTRAVENOUS
  Filled 2018-04-12 (×3): qty 18

## 2018-04-12 MED ORDER — ARTEMETHER-LUMEFANTRINE 20-120 MG PO TABS: ORAL_TABLET | ORAL | 0 refills | Status: DC

## 2018-04-12 MED FILL — ATOVAQUONE-PROGUANIL 250-10: 250-100 | 3 days supply | Qty: 12 | Fill #0

## 2018-04-12 NOTE — Progress Notes (Signed)
Jefferey Rae to be D/C'd Home per MD order.  Discussed with the patient and all questions fully answered.  VSS, Skin clean, dry and intact without evidence of skin break down, no evidence of skin tears noted. IV catheter discontinued intact. Site without signs and symptoms of complications. Dressing and pressure applied.  An After Visit Summary was printed and given to the patient. [facility].  D/c education completed with patient/family including follow up instructions, medication list, d/c activities limitations if indicated, with other d/c instructions as indicated by MD - patient able to verbalize understanding, all questions fully answered.   Patient instructed to return to ED, call 911, or call MD for any changes in condition.   Patient escorted off unit with family member and D/C home via private auto.  Garry HeaterDannon D Ladaija Dimino 04/12/2018 7:42 PM

## 2018-04-12 NOTE — Progress Notes (Signed)
PROGRESS NOTE                                                                                                                                                                                                             Patient Demographics:    Brian Oconnell, is a 32 y.o. male, DOB - May 07, 1986, ZHY:[id number]  Admit date - 04/11/2018   Admitting Physician Leroy Sea, MD  Outpatient Primary MD for the patient is Patient, No Pcp Per  LOS - 1  Chief Complaint  Patient presents with  . Altered Mental Status       Brief Narrative     Brian Oconnell  is a 32 y.o. male, who was originally from [place], medical history except alcohol consumption which is pretty heavy last drink 7 days ago, patient recently visited [place] about 5 weeks ago, came back and was feeling well however up for the last 7 to 8 days he has been not feeling well with mild generalized body aches, generalized weakness, he has not had any alcohol in 7 days, went to an urgent care where he was given ibuprofen and Benadryl, he was taking plenty of ibuprofen to feel better however for the last 14 to 15 hours he started experiencing some tongue swelling and was unable to talk properly.    He then came to the ER where work-up suggested that he possibly had a malarial infection with mild allergic reaction to NSAIDs causing early angioedema.  He had thankfully taken some Benadryl and his swelling is already going down.  He was seen by ID and I was requested to admit for further malaria treatment.    Subjective:    Brian Oconnell today has, No headache, No chest pain, No abdominal pain - No Nausea, No new weakness tingling or numbness, No Cough - SOB.     Assessment  & Plan :      1.  Acute malaria suspicious for falciparum.  placed on Artesunate by ID, clinically stable and feels better.  2.  Angioedema likely allergic to NSAIDs.  resolved after IV Solumedrol and Benadryl.  3.  Counseled to quit, last  drink 7 days ago, placed on folic acid and thiamine, low-dose scheduled Librium.  Monitor on IV Ativan as needed.  Should not go in DTs as last drink was 7 days ago.  4. Anemia and Thrombocytopenia - due to malaria, monitor.    Family Communication  :  Sister  Code Status :  Full  Disposition Plan  :  Home in 1-2 days  Consults  :  ID  Procedures  :    CT Head - negative  DVT Prophylaxis  :  Heparin    Lab Results  Component Value Date   PLT 92 (L) 04/12/2018    Diet :  Diet Order            Diet regular Room service appropriate? Yes; Fluid consistency: Thin  Diet effective now               Inpatient Medications Scheduled Meds: . artesunate (for compassionate use)  180 mg Intravenous Q12H   Followed by  . [START ON 04/14/2018] artesunate (for compassionate use)  180 mg Intravenous Once  . chlordiazePOXIDE  10 mg Oral TID  . folic acid  1 mg Oral Daily  . heparin  5,000 Units Subcutaneous Q8H  . Influenza vac split quadrivalent PF  0.5 mL Intramuscular Tomorrow-1000  . ondansetron (ZOFRAN) IV  4 mg Intravenous Once  . sodium chloride flush  3 mL Intravenous Q12H  . thiamine injection  100 mg Intravenous Daily   Continuous Infusions:  PRN Meds:.acetaminophen **OR** acetaminophen, bisacodyl, LORazepam  Antibiotics  :   Anti-infectives (From admission, onward)   Start     Dose/Rate Route Frequency Ordered Stop   04/14/18 0200  artesunate (for compassionate use) 10 MG/ML IV syringe 174 mg  Status:  Discontinued     2.4 mg/kg  72.5 kg Intravenous Once 04/12/18 0036 04/12/18 0101   04/14/18 0200  artesunate (for compassionate use) 10 MG/ML IV syringe 180 mg     180 mg Intravenous Once 04/12/18 0101     04/12/18 1000  artemether-lumefantrine (COARTEM) 20-120 MG tablet 4 tablet  Status:  Discontinued     4 tablet Oral 2 times daily 04/11/18 1321 04/12/18 0055   04/12/18 0200  artesunate (for compassionate use) 10 MG/ML IV syringe 174 mg  Status:  Discontinued       2.4 mg/kg  72.5 kg Intravenous Every 12 hours 04/12/18 0036 04/12/18 0101   04/12/18 0200  artesunate (for compassionate use) 10 MG/ML IV syringe 180 mg     180 mg Intravenous Every 12 hours 04/12/18 0101 04/13/18 1359   04/11/18 2030  artemether-lumefantrine (COARTEM) 20-120 MG tablet 4 tablet     4 tablet Oral  Once 04/11/18 1320 04/11/18 2242   04/11/18 1330  artemether-lumefantrine (COARTEM) 20-120 MG tablet 4 tablet     4 tablet Oral  Once 04/11/18 1319 04/11/18 1429          Objective:   Vitals:   04/11/18 1200 04/11/18 1619 04/11/18 1643 04/11/18 2137  BP: 109/86 126/80 115/71 116/81  Pulse: 88 88 83 80  Resp: (!) 21 16 19 18   Temp:   98.6 F (37 C) 98.1 F (36.7 C)  TempSrc:   Oral Oral  SpO2: 99% 100% 100% 99%  Weight:   72.5 kg   Height:   6' (1.829 m)     Wt Readings from Last 3 Encounters:  04/11/18 72.5 kg     Intake/Output Summary (Last 24 hours) at 04/12/2018 0950 Last data filed at 04/12/2018 0658 Gross per 24 hour  Intake 1589.77 ml  Output 300 ml  Net 1289.77 ml     Physical Exam  Awake Alert, Oriented X 3, No new F.N deficits, Normal affect Brian Oconnell,PERRAL Supple Neck,No JVD, No cervical lymphadenopathy appriciated.  Symmetrical Chest wall movement, Good air movement bilaterally, CTAB RRR,No Gallops,Rubs or  new Murmurs, No Parasternal Heave +ve B.Sounds, Abd Soft, No tenderness, No organomegaly appriciated, No rebound - guarding or rigidity. No Cyanosis, Clubbing or edema, No new Rash or bruise       Data Review:    CBC Recent Labs  Lab 04/11/18 1135 04/11/18 1913 04/12/18 0501  WBC 8.5 8.2 5.5  HGB 12.5* 11.8* 10.4*  HCT 39.0 35.6* 32.0*  PLT PLATELET CLUMPS NOTED ON SMEAR, UNABLE TO ESTIMATE PLATELET CLUMPS NOTED ON SMEAR, COUNT APPEARS DECREASED 92*  MCV 82.1 80.9 81.6  MCH 26.3 26.8 26.5  MCHC 32.1 33.1 32.5  RDW 14.5 14.4 14.7  LYMPHSABS 1.5 1.7  --   MONOABS 1.6* 1.4*  --   EOSABS 0.0 0.0  --   BASOSABS 0.0 0.0  --      Chemistries  Recent Labs  Lab 04/11/18 1135 04/11/18 1913 04/12/18 0501  NA 134* 135 136  K 3.5 3.6 4.3  CL 98 101 103  CO2 23 24 25   GLUCOSE 104* 84 106*  BUN 24* 26* 26*  CREATININE 1.22 0.97 0.94  CALCIUM 8.8* 8.4* 8.6*  MG  --   --  2.0  AST 46* 34 31  ALT 21 18 18   ALKPHOS 41 39 33*  BILITOT 3.2* 2.1* 1.3*   ------------------------------------------------------------------------------------------------------------------ No results for input(s): CHOL, HDL, LDLCALC, TRIG, CHOLHDL, LDLDIRECT in the last 72 hours.  No results found for: HGBA1C ------------------------------------------------------------------------------------------------------------------ No results for input(s): TSH, T4TOTAL, T3FREE, THYROIDAB in the last 72 hours.  Invalid input(s): FREET3 ------------------------------------------------------------------------------------------------------------------ No results for input(s): VITAMINB12, FOLATE, FERRITIN, TIBC, IRON, RETICCTPCT in the last 72 hours.  Coagulation profile No results for input(s): INR, PROTIME in the last 168 hours.  No results for input(s): DDIMER in the last 72 hours.  Cardiac Enzymes No results for input(s): CKMB, TROPONINI, MYOGLOBIN in the last 168 hours.  Invalid input(s): CK ------------------------------------------------------------------------------------------------------------------ No results found for: BNP  Micro Results No results found for this or any previous visit (from the past 240 hour(s)).  Radiology Reports Dg Chest 2 View  Result Date: 04/11/2018 CLINICAL DATA:  Cough for a week. EXAM: CHEST - 2 VIEW COMPARISON:  None. FINDINGS: Heart size and mediastinal contours are within normal limits. Lungs are clear. No pleural effusion or pneumothorax seen. Osseous structures about the chest are unremarkable. IMPRESSION: No active cardiopulmonary disease. No evidence of pneumonia or pulmonary edema.  Electronically Signed   By: Bary RichardStan  Maynard M.D.   On: 04/11/2018 12:33   Ct Head Wo Contrast  Result Date: 04/11/2018 CLINICAL DATA:  Altered level of consciousness EXAM: CT HEAD WITHOUT CONTRAST TECHNIQUE: Contiguous axial images were obtained from the base of the skull through the vertex without intravenous contrast. COMPARISON:  None. FINDINGS: Brain: No evidence of acute infarction, hemorrhage, hydrocephalus, extra-axial collection or mass lesion/mass effect. Vascular: Negative for hyperdense vessel Skull: Negative Sinuses/Orbits: Bubbly secretions right maxillary sinus. Remaining sinuses clear. Normal orbit. Other: None IMPRESSION: Negative CT brain Bubbly secretions right maxillary sinus. Electronically Signed   By: Marlan Palauharles  Clark M.D.   On: 04/11/2018 13:03    Time Spent in minutes  30   Susa RaringPrashant Chaka Boyson M.D on 04/12/2018 at 9:50 AM  To page go to [email or web address] - password [email or web address]

## 2018-04-12 NOTE — Discharge Summary (Addendum)
Brian Oconnell ZOX:[id number] DOB: 09-Jun-1985 DOA: 04/11/2018  PCP: Patient, No Pcp Per  Admit date: 04/11/2018  Discharge date: 04/12/2018  Admitted From: Home  Disposition:  Home   Recommendations for Outpatient Follow-up:   Follow up with PCP in 1-2 weeks  PCP Please obtain BMP/CBC, 2 view CXR in 1week,  (see Discharge instructions)   PCP Please follow up on the following pending results:    Home Health: None   Equipment/Devices: None  Consultations: ID Discharge Condition: Fair   CODE STATUS: Full   Diet Recommendation: Heart Healthy    Chief Complaint  Patient presents with  . Altered Mental Status     Brief history of present illness from the day of admission and additional interim summary    Brian Oconnell a31 y.o.male,who was originally from [place], medical history except alcohol consumption which is pretty heavy last drink 7 days ago, patient recently visited [place] about 5 weeks ago, came back and was feeling well however up for the last 7 to 8 days he has been not feeling well with mild generalized body aches, generalized weakness, he has not had any alcohol in 7 days, went to an urgent care where he was given ibuprofen and Benadryl, he was taking plenty of ibuprofen to feel better however for the last 14 to 15 hours he started experiencing some tongue swelling and was unable to talk properly.   He then came to the ER where work-up suggested that he possibly had a malarial infection with mild allergic reaction to NSAIDs causing early angioedema. He had thankfully taken some Benadryl and his swelling is already going down. He was seen by ID and I was requested to admit for further malaria treatment.                                                                 Hospital Course     1.Acute malaria suspicious for falciparum. placed on Artesunate by ID, clinically stable and feels better, per ID 3 days of PO Coartem and DC.  He is recovered much quicker than expected.  2.Angioedema likely allergic to NSAIDs. resolved after IV Solumedrol and Benadryl.  3.Alcohol use- Counseled to quit, last drink 7 days ago, was placed on folic acid and thiamine, no DTs as last drink was 7 days ago.  4. Anemia and Thrombocytopenia - due to malaria, stable, follow with ID in 1 week.    Discharge diagnosis     Active Problems:   Malaria   Drug allergy   Alcohol abuse   Anemia    Discharge instructions    Discharge Instructions    Diet - low sodium heart healthy   Complete by:  As directed    Discharge instructions   Complete by:  As directed    Follow with Primary  MD in 7 days   Get CBC, CMP checked  by Primary MD  in 5-7 days   Activity: As tolerated with Full fall precautions use walker/cane & assistance as needed  Disposition Home    Diet: Heart Healthy     Special Instructions: If you have smoked or chewed Tobacco  in the last 2 yrs please stop smoking, stop any regular Alcohol  and or any Recreational drug use.  On your next visit with your primary care physician please Get Medicines reviewed and adjusted.  Please request your Prim.MD to go over all Hospital Tests and Procedure/Radiological results at the follow up, please get all Hospital records sent to your Prim MD by signing hospital release before you go home.  If you experience worsening of your admission symptoms, develop shortness of breath, life threatening emergency, suicidal or homicidal thoughts you must seek medical attention immediately by calling 911 or calling your MD immediately  if symptoms less severe.  You Must read complete instructions/literature along with all the possible adverse reactions/side effects for all the Medicines you take and that have been prescribed to you.  Take any new Medicines after you have completely understood and accpet all the possible adverse reactions/side effects.   Increase activity slowly   Complete by:  As directed       Discharge Medications   Allergies as of 04/12/2018      Reactions   Motrin [ibuprofen]    angioedema      Medication List    STOP taking these medications   IBUPROFEN PO     TAKE these medications   artemether-lumefantrine 20-120 MG Tabs tablet Commonly known as:  COARTEM Take 4 tablets by mouth 2 (two) times daily.   diphenhydrAMINE 25 MG tablet Commonly known as:  BENADRYL Take 2 tablets (50 mg total) by mouth at bedtime as needed for sleep.   ondansetron 4 MG disintegrating tablet Commonly known as:  ZOFRAN-ODT Take 1 tablet (4 mg total) by mouth every 8 (eight) hours as needed for nausea or vomiting.       Follow-up Information    Judyann MunsonSnider, Cynthia, MD. Schedule an appointment as soon as possible for a visit in 1 week(s).   Specialty:  Infectious Diseases Contact information: [address] [place] [address] [phone]           Major procedures and Radiology Reports - PLEASE review detailed and final reports thoroughly  -      Dg Chest 2 View  Result Date: 04/11/2018 CLINICAL DATA:  Cough for a week. EXAM: CHEST - 2 VIEW COMPARISON:  None. FINDINGS: Heart size and mediastinal contours are within normal limits. Lungs are clear. No pleural effusion or pneumothorax seen. Osseous structures about the chest are unremarkable. IMPRESSION: No active cardiopulmonary disease. No evidence of pneumonia or pulmonary edema. Electronically Signed   By: Bary RichardStan  Maynard M.D.   On: 04/11/2018 12:33   Ct Head Wo Contrast  Result Date: 04/11/2018 CLINICAL DATA:  Altered level of consciousness EXAM: CT HEAD WITHOUT CONTRAST TECHNIQUE: Contiguous axial images were obtained from the base of the skull through the vertex without intravenous contrast. COMPARISON:  None. FINDINGS:  Brain: No evidence of acute infarction, hemorrhage, hydrocephalus, extra-axial collection or mass lesion/mass effect. Vascular: Negative for hyperdense vessel Skull: Negative Sinuses/Orbits: Bubbly secretions right maxillary sinus. Remaining sinuses clear. Normal orbit. Other: None IMPRESSION: Negative CT brain Bubbly secretions right maxillary sinus. Electronically Signed   By: Marlan Palauharles  Clark M.D.  On: 04/11/2018 13:03    Micro Results    No results found for this or any previous visit (from the past 240 hour(s)).  Today   Subjective    Brian Oconnell today has no headache,no chest abdominal pain,no new weakness tingling or numbness, feels much better wants to go home today.    Objective   Blood pressure 116/81, pulse 80, temperature 98.1 F (36.7 C), temperature source Oral, resp. rate 18, height 6' (1.829 m), weight 72.5 kg, SpO2 99 %.   Intake/Output Summary (Last 24 hours) at 04/12/2018 1355 Last data filed at 04/12/2018 1020 Gross per 24 hour  Intake 1592.77 ml  Output 300 ml  Net 1292.77 ml    Exam Awake Alert, Oriented x 3, No new F.N deficits, Normal affect [place].AT,PERRAL Supple Neck,No JVD, No cervical lymphadenopathy appriciated.  Symmetrical Chest wall movement, Good air movement bilaterally, CTAB RRR,No Gallops,Rubs or new Murmurs, No Parasternal Heave +ve B.Sounds, Abd Soft, Non tender, No organomegaly appriciated, No rebound -guarding or rigidity. No Cyanosis, Clubbing or edema, No new Rash or bruise   Data Review   CBC w Diff:  Lab Results  Component Value Date   WBC 5.5 04/12/2018   HGB 10.4 (L) 04/12/2018   HCT 32.0 (L) 04/12/2018   PLT 92 (L) 04/12/2018   LYMPHOPCT 21 04/11/2018   BANDSPCT 0 04/11/2018   MONOPCT 17 04/11/2018   EOSPCT 0 04/11/2018   BASOPCT 0 04/11/2018    CMP:  Lab Results  Component Value Date   NA 136 04/12/2018   K 4.3 04/12/2018   CL 103 04/12/2018   CO2 25 04/12/2018   BUN 26 (H) 04/12/2018   CREATININE 0.94  04/12/2018   PROT 6.4 (L) 04/12/2018   ALBUMIN 2.5 (L) 04/12/2018   BILITOT 1.3 (H) 04/12/2018   ALKPHOS 33 (L) 04/12/2018   AST 31 04/12/2018   ALT 18 04/12/2018  .   Total Time in preparing paper work, data evaluation and todays exam - 35 minutes  Susa Raring M.D on 04/12/2018 at 1:55 PM  [facility]   Office  [phone]

## 2018-04-12 NOTE — Discharge Instructions (Signed)
Follow with Primary MD in 7 days  ° °Get CBC, CMP  checked  by Primary MD  in 5-7 days   ° °Activity: As tolerated with Full fall precautions use walker/cane & assistance as needed ° °Disposition Home  ° °Diet: Heart Healthy   ° °Special Instructions: If you have smoked or chewed Tobacco  in the last 2 yrs please stop smoking, stop any regular Alcohol  and or any Recreational drug use. ° °On your next visit with your primary care physician please Get Medicines reviewed and adjusted. ° °Please request your Prim.MD to go over all Hospital Tests and Procedure/Radiological results at the follow up, please get all Hospital records sent to your Prim MD by signing hospital release before you go home. ° °If you experience worsening of your admission symptoms, develop shortness of breath, life threatening emergency, suicidal or homicidal thoughts you must seek medical attention immediately by calling 911 or calling your MD immediately  if symptoms less severe. ° °You Must read complete instructions/literature along with all the possible adverse reactions/side effects for all the Medicines you take and that have been prescribed to you. Take any new Medicines after you have completely understood and accpet all the possible adverse reactions/side effects.  °  °

## 2018-04-12 NOTE — Evaluation (Signed)
Clinical/Bedside Swallow Evaluation Patient Details  Name: Brian Oconnell MRN: [id number] Date of Birth: [birth year]  Today's Date: 04/12/2018 Time: SLP Start Time (ACUTE ONLY): 0825 SLP Stop Time (ACUTE ONLY): 0835 SLP Time Calculation (min) (ACUTE ONLY): 10 min  Past Medical History: History reviewed. No pertinent past medical history. Past Surgical History: History reviewed. No pertinent surgical history. HPI:  Pt is a 32 yo male who recently visited [place] and presented to urgent care with likely viral infection. Pt then presented to ER 04/11/18 with AMS and swelling of his tongue, difficulty swallowing saliva. PT was admitted for malaria and angioedema likely from NSAIDS. PMH significant for heavy alcohol use.   Assessment / Plan / Recommendation Clinical Impression  Pt's oropharyngeal swallow appears to be [facility]. He describes mild soreness (rated 2/10) on the tip of his tongue, but otherwise believes that his tongue has normalized. Recommend regular diet and thin liquids - no SLP f/u needed. SLP Visit Diagnosis: Dysphagia, unspecified (R13.10)    Aspiration Risk  No limitations    Diet Recommendation Regular;Thin liquid   Liquid Administration via: Cup;Straw Medication Administration: Whole meds with liquid Supervision: Patient able to self feed Compensations: Slow rate;Small sips/bites Postural Changes: Seated upright at 90 degrees    Other  Recommendations Oral Care Recommendations: Oral care BID   Follow up Recommendations None      Frequency and Duration            Prognosis        Swallow Study   General HPI: Pt is a 32 yo male who recently visited [place] and presented to urgent care with likely viral infection. Pt then presented to ER 04/11/18 with AMS and swelling of his tongue, difficulty swallowing saliva. PT was admitted for malaria and angioedema likely from NSAIDS. PMH significant for heavy alcohol use. Type of Study: Bedside Swallow Evaluation Previous  Swallow Assessment: none in chart Diet Prior to this Study: Dysphagia 3 (soft);Thin liquids Temperature Spikes Noted: No Respiratory Status: Room air History of Recent Intubation: No Behavior/Cognition: Alert;Cooperative;Pleasant mood Oral Cavity Assessment: Within Functional Limits Oral Care Completed by SLP: No Oral Cavity - Dentition: Adequate natural dentition Vision: Functional for self-feeding Self-Feeding Abilities: Able to feed self Patient Positioning: Upright in bed Baseline Vocal Quality: Normal Volitional Cough: Strong Volitional Swallow: Able to elicit    Oral/Motor/Sensory Function Overall Oral Motor/Sensory Function: Within functional limits   Ice Chips Ice chips: Not tested   Thin Liquid Thin Liquid: Within functional limits Presentation: Self Fed;Straw    Nectar Thick Nectar Thick Liquid: Not tested   Honey Thick Honey Thick Liquid: Not tested   Puree Puree: Within functional limits Presentation: Self Fed;Spoon   Solid     Solid: Within functional limits Presentation: Self Georjean ModeFed      Paiewonsky, Albino Bufford 04/12/2018,9:57 AM  Maxcine HamLaura Paiewonsky, M.A. CCC-SLP Acute Herbal[facility] Pager [phone] Office [phone]

## 2018-04-12 NOTE — Progress Notes (Signed)
[facility]    Date of Admission:  04/11/2018   Total days of antibiotics 2  ID: Brian Oconnell is a 32 y.o. male with ibuprofen angioedema with p.falciparum malaria Active Problems:   Malaria   Drug allergy   Alcohol abuse   Anemia    Subjective: Afebrile. He feels much improved. No subjective fevers, no difficulty with talking or eating.   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 artesunate (for compassionate use)  180 mg Intravenous Q12H   Followed by  . [START ON 04/14/2018] artesunate (for compassionate use)  180 mg Intravenous Once  . chlordiazePOXIDE  10 mg Oral TID  . folic acid  1 mg Oral Daily  . heparin  5,000 Units Subcutaneous Q8H  . Influenza vac split quadrivalent PF  0.5 mL Intramuscular Tomorrow-1000  . ondansetron (ZOFRAN) IV  4 mg Intravenous Once  . sodium chloride flush  3 mL Intravenous Q12H  . [START ON 04/13/2018] thiamine  100 mg Oral Daily    Objective: Vital signs in last 24 hours: Temp:  [98.1 F (36.7 C)-98.6 F (37 C)] 98.1 F (36.7 C) (11/25 2137) Pulse Rate:  [80-88] 80 (11/25 2137) Resp:  [16-19] 18 (11/25 2137) BP: (115-126)/(71-81) 116/81 (11/25 2137) SpO2:  [99 %-100 %] 99 % (11/25 2137) Weight:  [72.5 kg] 72.5 kg (11/25 1643) Physical Exam  Constitutional: He is oriented to person, place, and time. He appears well-developed and well-nourished. No distress.  HENT:  Mouth/Throat: Oropharynx is clear and moist. No oropharyngeal exudate.  Cardiovascular: Normal rate, regular rhythm and normal heart sounds. Exam reveals no gallop and no friction rub.  No murmur heard.  Pulmonary/Chest: Effort normal and breath sounds normal. No respiratory distress. He has no wheezes.  Abdominal: Soft. Bowel sounds are normal. He exhibits no distension. There is no tenderness.  Lymphadenopathy:  He has no cervical  adenopathy.  Neurological: He is alert and oriented to person, place, and time.  Skin: Skin is warm and dry. No rash noted. No erythema.  Psychiatric: He has a normal mood and affect. His behavior is normal.    Lab Results Recent Labs    04/11/18 1913 04/12/18 0501  WBC 8.2 5.5  HGB 11.8* 10.4*  HCT 35.6* 32.0*  NA 135 136  K 3.6 4.3  CL 101 103  CO2 24 25  BUN 26* 26*  CREATININE 0.97 0.94   Liver Panel Recent Labs    04/11/18 1913 04/12/18 0501  PROT 7.0 6.4*  ALBUMIN 2.7* 2.5*  AST 34 31  ALT 18 18  ALKPHOS 39 33*  BILITOT 2.1* 1.3*    Microbiology:  smear -- Intraerythrocyte parasites identified     Comment: relatively diffuse     Studies/Results: Dg Chest 2 View  Result Date: 04/11/2018 CLINICAL DATA:  Cough for a week. EXAM: CHEST - 2 VIEW COMPARISON:  None. FINDINGS: Heart size and mediastinal contours are within normal limits. Lungs are clear. No pleural effusion or pneumothorax seen. Osseous structures about the chest are unremarkable. IMPRESSION: No active cardiopulmonary disease. No evidence of pneumonia or pulmonary edema. Electronically Signed   By: Bary RichardStan  Maynard M.D.   On: 04/11/2018 12:33   Ct Head Wo Contrast  Result Date: 04/11/2018 CLINICAL DATA:  Altered level of consciousness EXAM: CT HEAD WITHOUT CONTRAST TECHNIQUE: Contiguous axial images were obtained  from the base of the skull through the vertex without intravenous contrast. COMPARISON:  None. FINDINGS: Brain: No evidence of acute infarction, hemorrhage, hydrocephalus, extra-axial collection or mass lesion/mass effect. Vascular: Negative for hyperdense vessel Skull: Negative Sinuses/Orbits: Bubbly secretions right maxillary sinus. Remaining sinuses clear. Normal orbit. Other: None IMPRESSION: Negative CT brain Bubbly secretions right maxillary sinus. Electronically Signed   By: Marlan Palau M.D.   On: 04/11/2018 13:03   Assessment/Plan:  Angioedema = much improved, thought to be  attributable to ibuprofen  Malaria = smear suggests improvement with decrease in ring forms, but a few more gametocyte. % still pending. Patient tolerated 1st and 2nd infusion of artesunate without difficulty. Appears much improved. We will send back remaining dose to [facility]. IRB consent form will be scanned in his chart and paperwork sent back to [facility] Hyperbilirubinemia improved  Will discharge on 3 days of atovaquone-proguanil 4 tabs daily x 3 d. To start first dose at 5pm today.  Anemia = hgb is decreased but I think it is in part dilutional.  AKI= improved with hydration  Will arrange follow up next week.    [facility] for Infectious Diseases Cell: [phone] Pager: [phone]  04/12/2018, 1:29 PM

## 2018-04-19 LAB — PARASITE EXAM, BLOOD: Parasite Exam, Blood: POSITIVE — AB

## 2018-04-20 ENCOUNTER — Ambulatory Visit (INDEPENDENT_AMBULATORY_CARE_PROVIDER_SITE_OTHER): Payer: Managed Care, Other (non HMO) | Admitting: Internal Medicine

## 2018-04-20 DIAGNOSIS — B54 Unspecified malaria: Secondary | ICD-10-CM | POA: Diagnosis not present

## 2018-04-20 NOTE — Patient Instructions (Signed)
For further trips back home, come visit our travel clinic for vaccines and anti-malaria medication

## 2018-04-22 NOTE — Progress Notes (Signed)
    Patient ID: Katrinka Blazinggboche Brewbaker, male   DOB: 12/21/1985, 32 y.o.   MRN: [id number]  HPI  32yo M with recent hospitalization for p.falciparum malaria c/b aki, angio edema for ibuprofen taken for fevers. He received 2 doses of artesunate and felt much improved. parastemia thought to be 3-5% . Awaiting confirmation. He finished 3 day course of malarone after artesunate infusion. He is feeling back to his usual health. Getting back to work Outpatient Encounter Medications as of 04/20/2018  Medication Sig  . diphenhydrAMINE (BENADRYL) 25 MG tablet Take 2 tablets (50 mg total) by mouth at bedtime as needed for sleep.  Marland Kitchen. ondansetron (ZOFRAN ODT) 4 MG disintegrating tablet Take 1 tablet (4 mg total) by mouth every 8 (eight) hours as needed for nausea or vomiting.   No facility-administered encounter medications on file as of 04/20/2018.      Patient Active Problem List   Diagnosis Date Noted  . Drug allergy 04/12/2018  . Alcohol abuse 04/12/2018  . Anemia 04/12/2018  . Hyperbilirubinemia   . Malaria 04/11/2018     Health Maintenance Due  Topic Date Due  . HIV Screening  04/14/2003  . TETANUS/TDAP  04/14/2007     Review of Systems Review of Systems  Constitutional: Negative for fever, chills, diaphoresis, activity change, appetite change, fatigue and unexpected weight change.  HENT: Negative for congestion, sore throat, rhinorrhea, sneezing, trouble swallowing and sinus pressure.  Eyes: Negative for photophobia and visual disturbance.  Respiratory: Negative for cough, chest tightness, shortness of breath, wheezing and stridor.  Cardiovascular: Negative for chest pain, palpitations and leg swelling.  Gastrointestinal: Negative for nausea, vomiting, abdominal pain, diarrhea, constipation, blood in stool, abdominal distention and anal bleeding.  Genitourinary: Negative for dysuria, hematuria, flank pain and difficulty urinating.  Musculoskeletal: Negative for myalgias, back pain, joint  swelling, arthralgias and gait problem.  Skin: Negative for color change, pallor, rash and wound.  Neurological: Negative for dizziness, tremors, weakness and light-headedness.  Hematological: Negative for adenopathy. Does not bruise/bleed easily.  Psychiatric/Behavioral: Negative for behavioral problems, confusion, sleep disturbance, dysphoric mood, decreased concentration and agitation.    Physical Exam  There were no vitals taken for this visit.  CBC Lab Results  Component Value Date   WBC 5.8 04/20/2018   RBC 3.99 (L) 04/20/2018   HGB 10.7 (L) 04/20/2018   HCT 32.4 (L) 04/20/2018   PLT 440 (H) 04/20/2018   MCV 81.2 04/20/2018   MCH 26.8 (L) 04/20/2018   MCHC 33.0 04/20/2018   RDW 14.7 04/20/2018   LYMPHSABS 1.7 04/11/2018   MONOABS 1.4 (H) 04/11/2018   EOSABS 0.0 04/11/2018    BMET Lab Results  Component Value Date   NA 136 04/12/2018   K 4.3 04/12/2018   CL 103 04/12/2018   CO2 25 04/12/2018   GLUCOSE 106 (H) 04/12/2018   BUN 26 (H) 04/12/2018   CREATININE 0.94 04/12/2018   CALCIUM 8.6 (L) 04/12/2018   GFRNONAA >60 04/12/2018   GFRAA >60 04/12/2018      Assessment and Plan  Malaria = appears clinically improved. Will check cbc and repeat blood smear. Try to track down original smears to verify parasetemia  For future travel, recommended he comes to travel clinic for pretravel counseling and anti-malaria meds

## 2018-04-25 LAB — CBC
HCT: 32.4 % — ABNORMAL LOW (ref 38.5–50.0)
HEMOGLOBIN: 10.7 g/dL — AB (ref 13.2–17.1)
MCH: 26.8 pg — ABNORMAL LOW (ref 27.0–33.0)
MCHC: 33 g/dL (ref 32.0–36.0)
MCV: 81.2 fL (ref 80.0–100.0)
MPV: 9.6 fL (ref 7.5–12.5)
PLATELETS: 440 10*3/uL — AB (ref 140–400)
RBC: 3.99 10*6/uL — AB (ref 4.20–5.80)
RDW: 14.7 % (ref 11.0–15.0)
WBC: 5.8 10*3/uL (ref 3.8–10.8)

## 2018-04-25 LAB — MALARIA SMEAR

## 2019-07-03 IMAGING — CT CT HEAD W/O CM
3 series · 15 of 47 positions shown, 18 images · non-contrast
Comparison: None.

CLINICAL DATA: Altered level of consciousness

EXAM:
CT HEAD WITHOUT CONTRAST
TECHNIQUE: Contiguous axial images were obtained from the base of the skull
through the vertex without intravenous contrast.

[Series 3: head 5.0 h30s · axial · 0.43mm/px · z∈[-184,-49]mm · 9 of 33 slices shown, 12 images]
[im 3/33  brain]
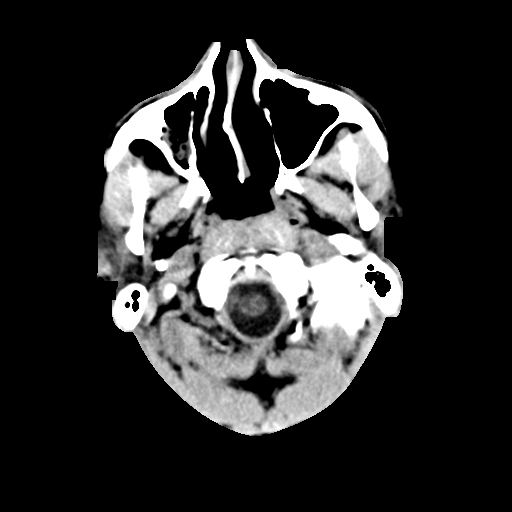
[im 3/33  bone]
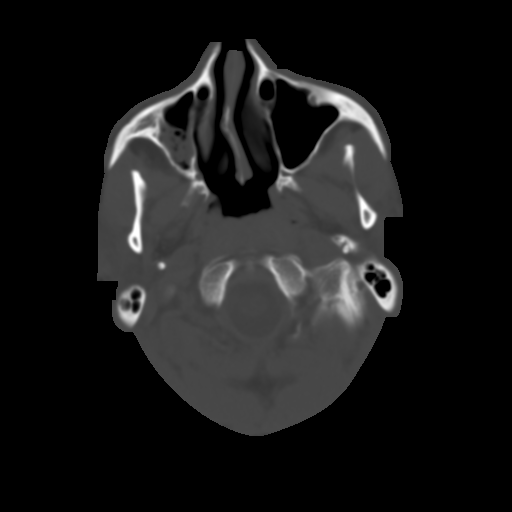
[im 6/33  brain]
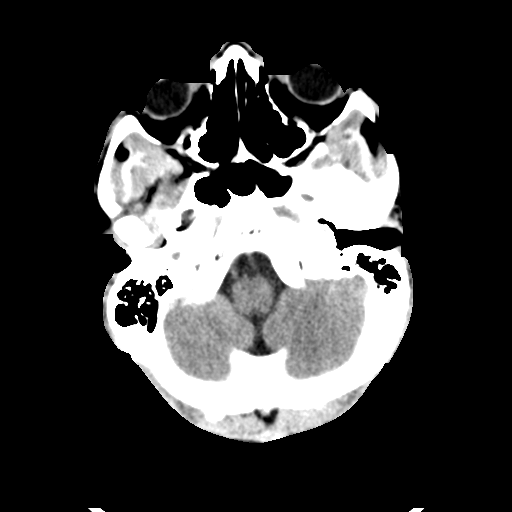
[im 9/33  brain]
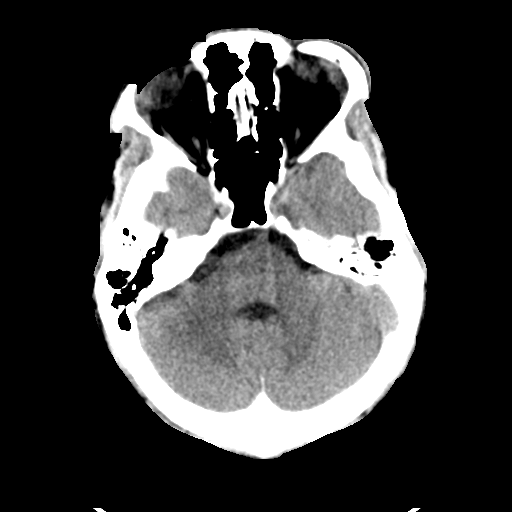
[im 13/33  brain]
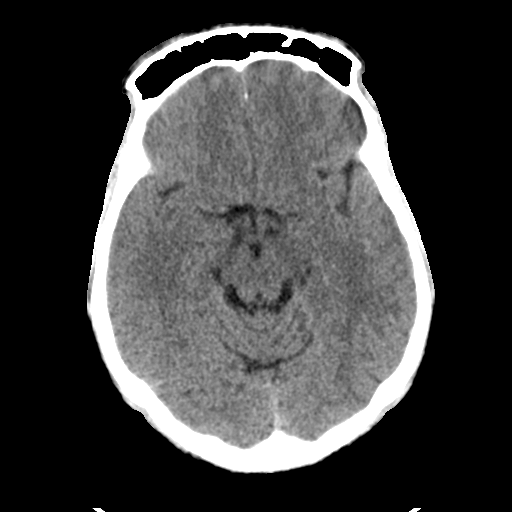
[im 17/33  brain]
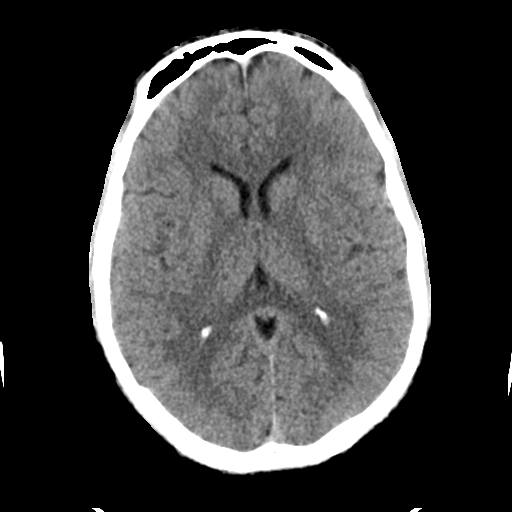
[im 17/33  bone]
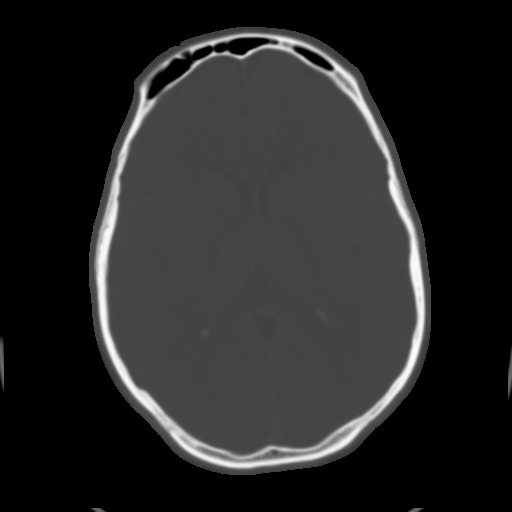
[im 20/33  brain]
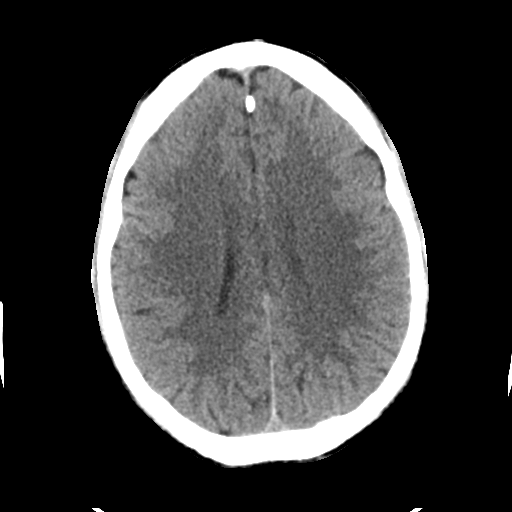
[im 24/33  brain]
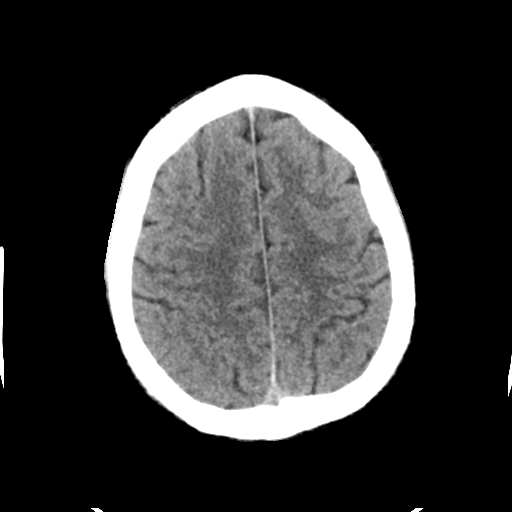
[im 27/33  brain]
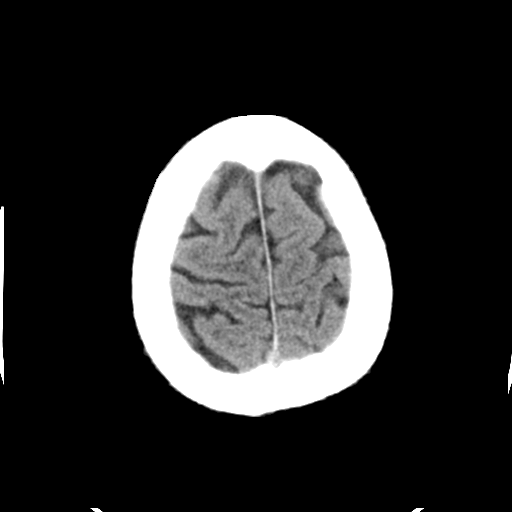
[im 30/33  brain]
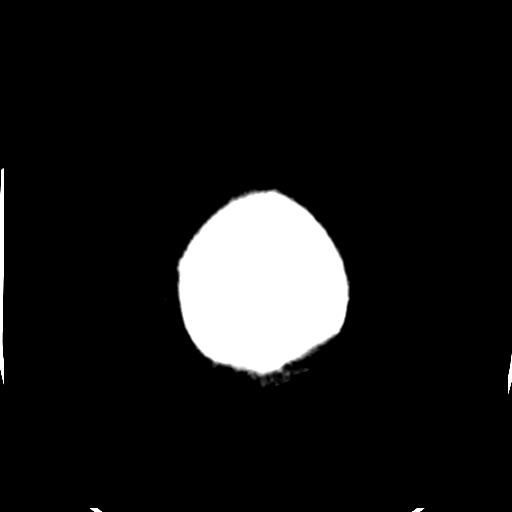
[im 30/33  bone]
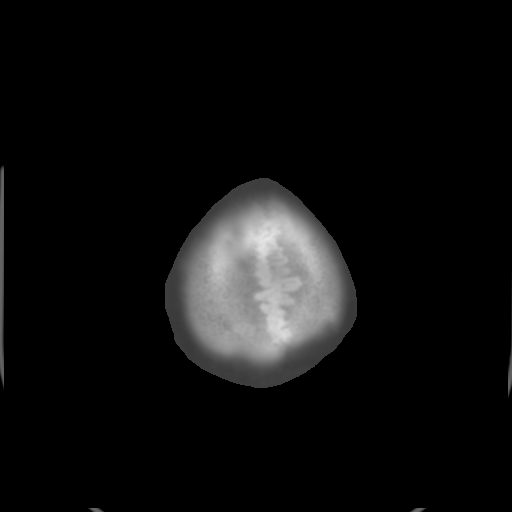

[Series 5: head 3.0 mpr cor · coronal · 0.32mm/px · 3 of 71 slices shown]
[im 24/71  brain]
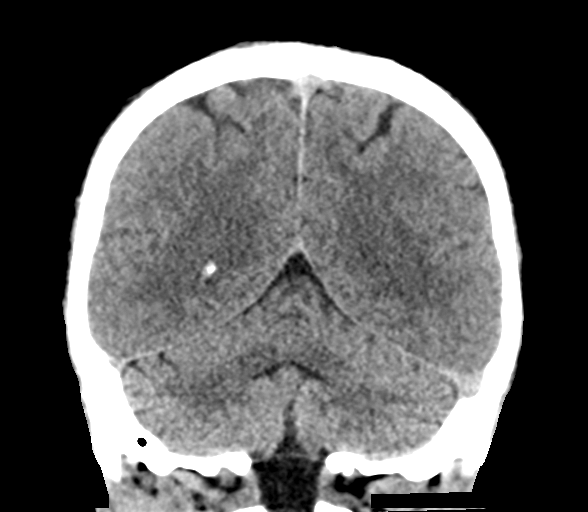
[im 32/71  brain]
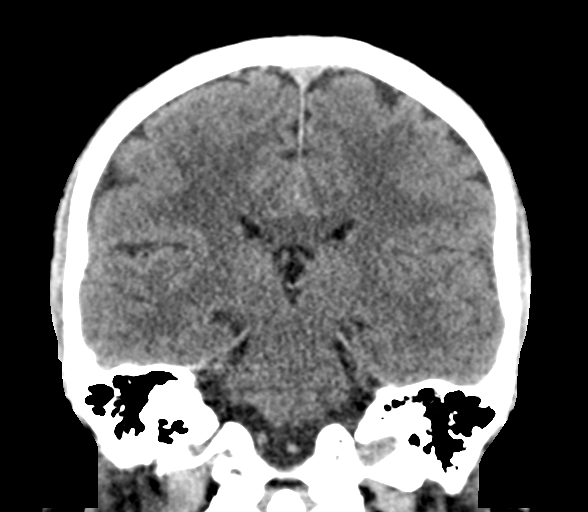
[im 39/71  brain]
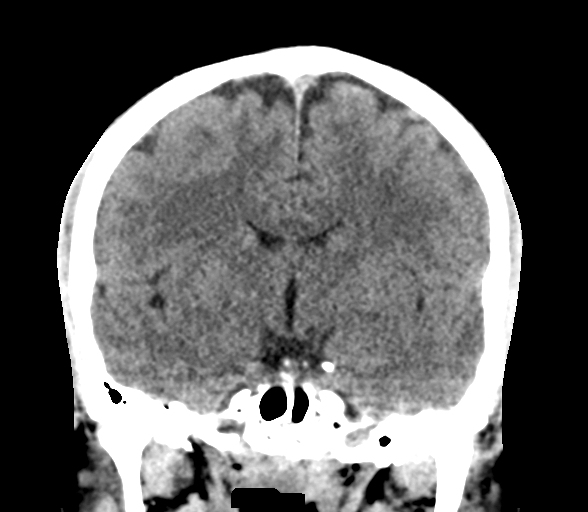

[Series 6: head 3.0 mpr sag · sagittal · 0.32mm/px · 3 of 58 slices shown]
[im 20/58  brain]
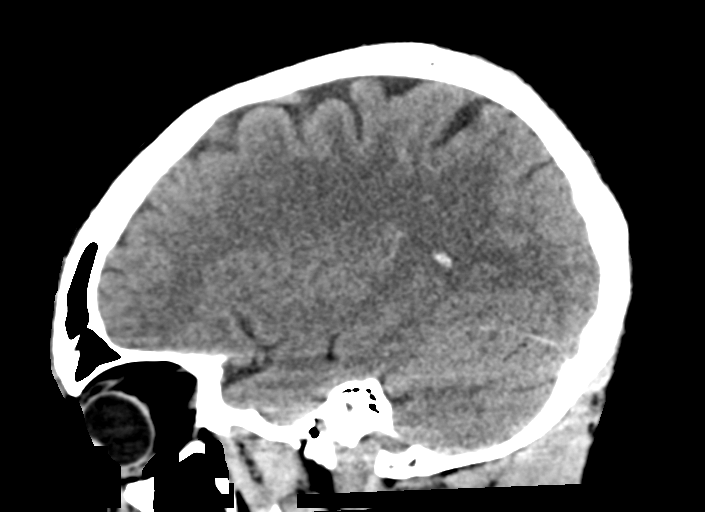
[im 29/58  brain]
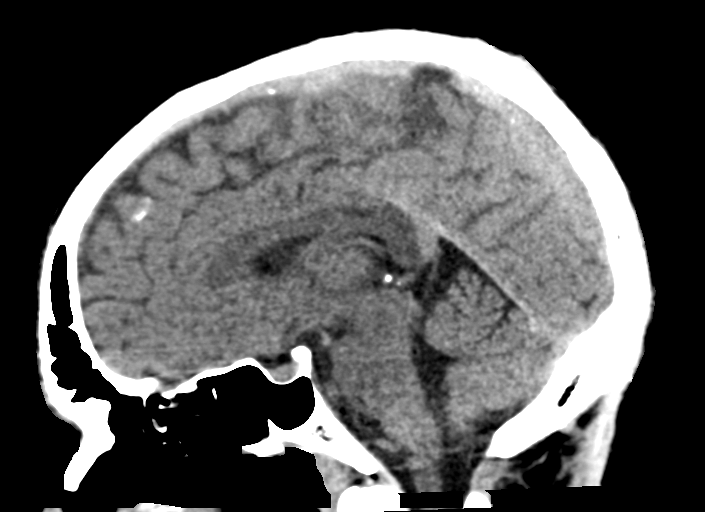
[im 39/58  brain]
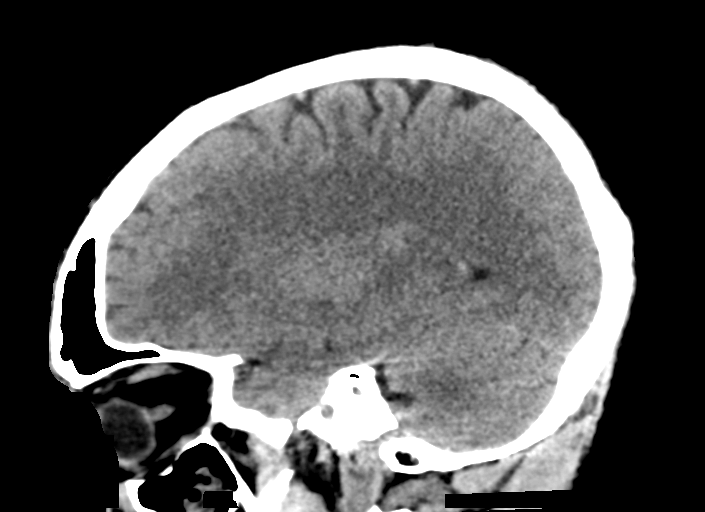

[15 of 47 positions shown; findings below may reference images not displayed]

FINDINGS: Brain: No evidence of acute infarction, hemorrhage, hydrocephalus,
extra-axial collection or mass lesion/mass effect.

Vascular: Negative for hyperdense vessel

Skull: Negative

Sinuses/Orbits: Bubbly secretions right maxillary sinus. Remaining
sinuses clear. Normal orbit.

Other: None
IMPRESSION: Negative CT brain

Bubbly secretions right maxillary sinus.

## 2019-07-03 IMAGING — CR DG CHEST 2V
2 series · 2 of 2 positions shown · non-contrast
Comparison: None.

CLINICAL DATA: Cough for a week.

EXAM:
CHEST - 2 VIEW

[chest lat]
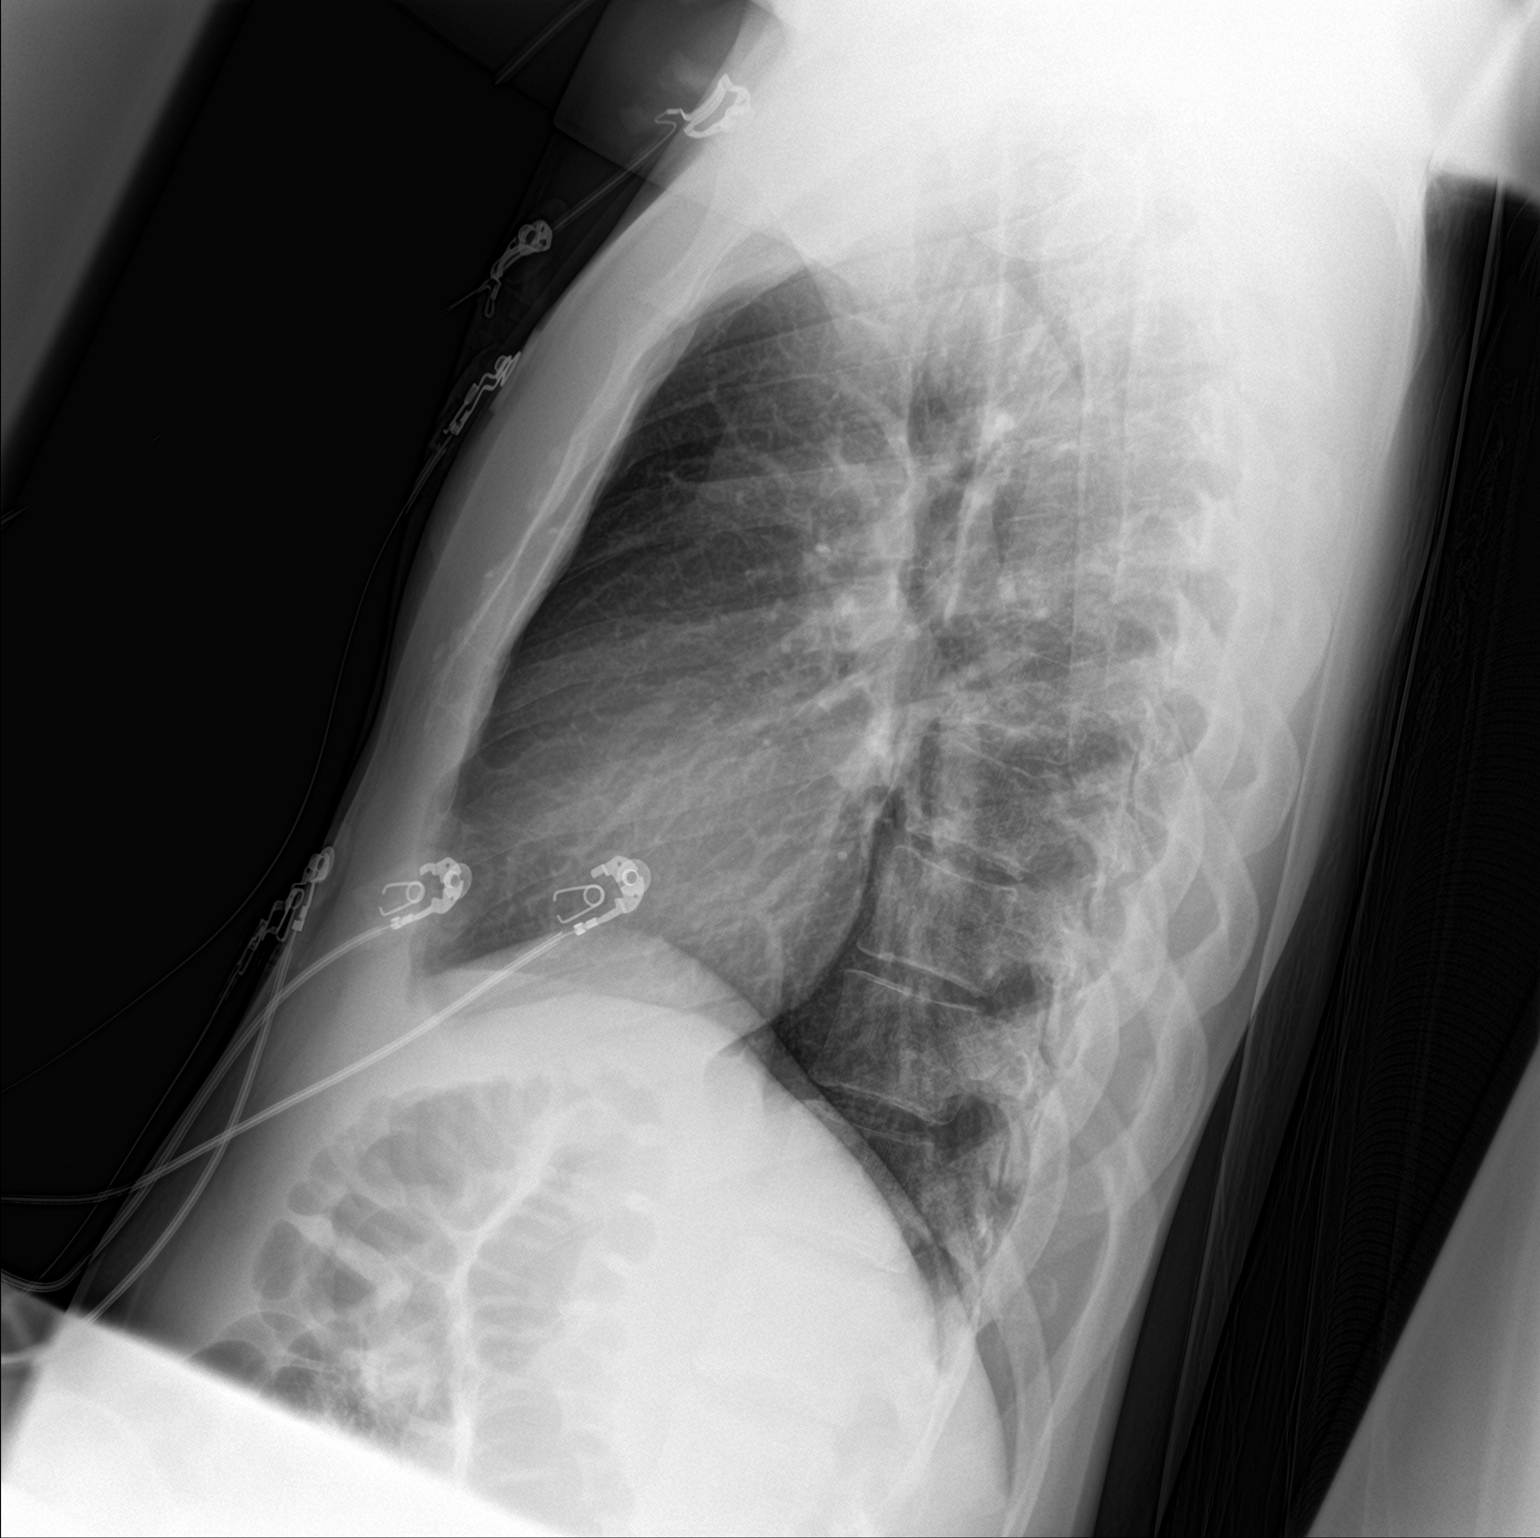

[chest ap]
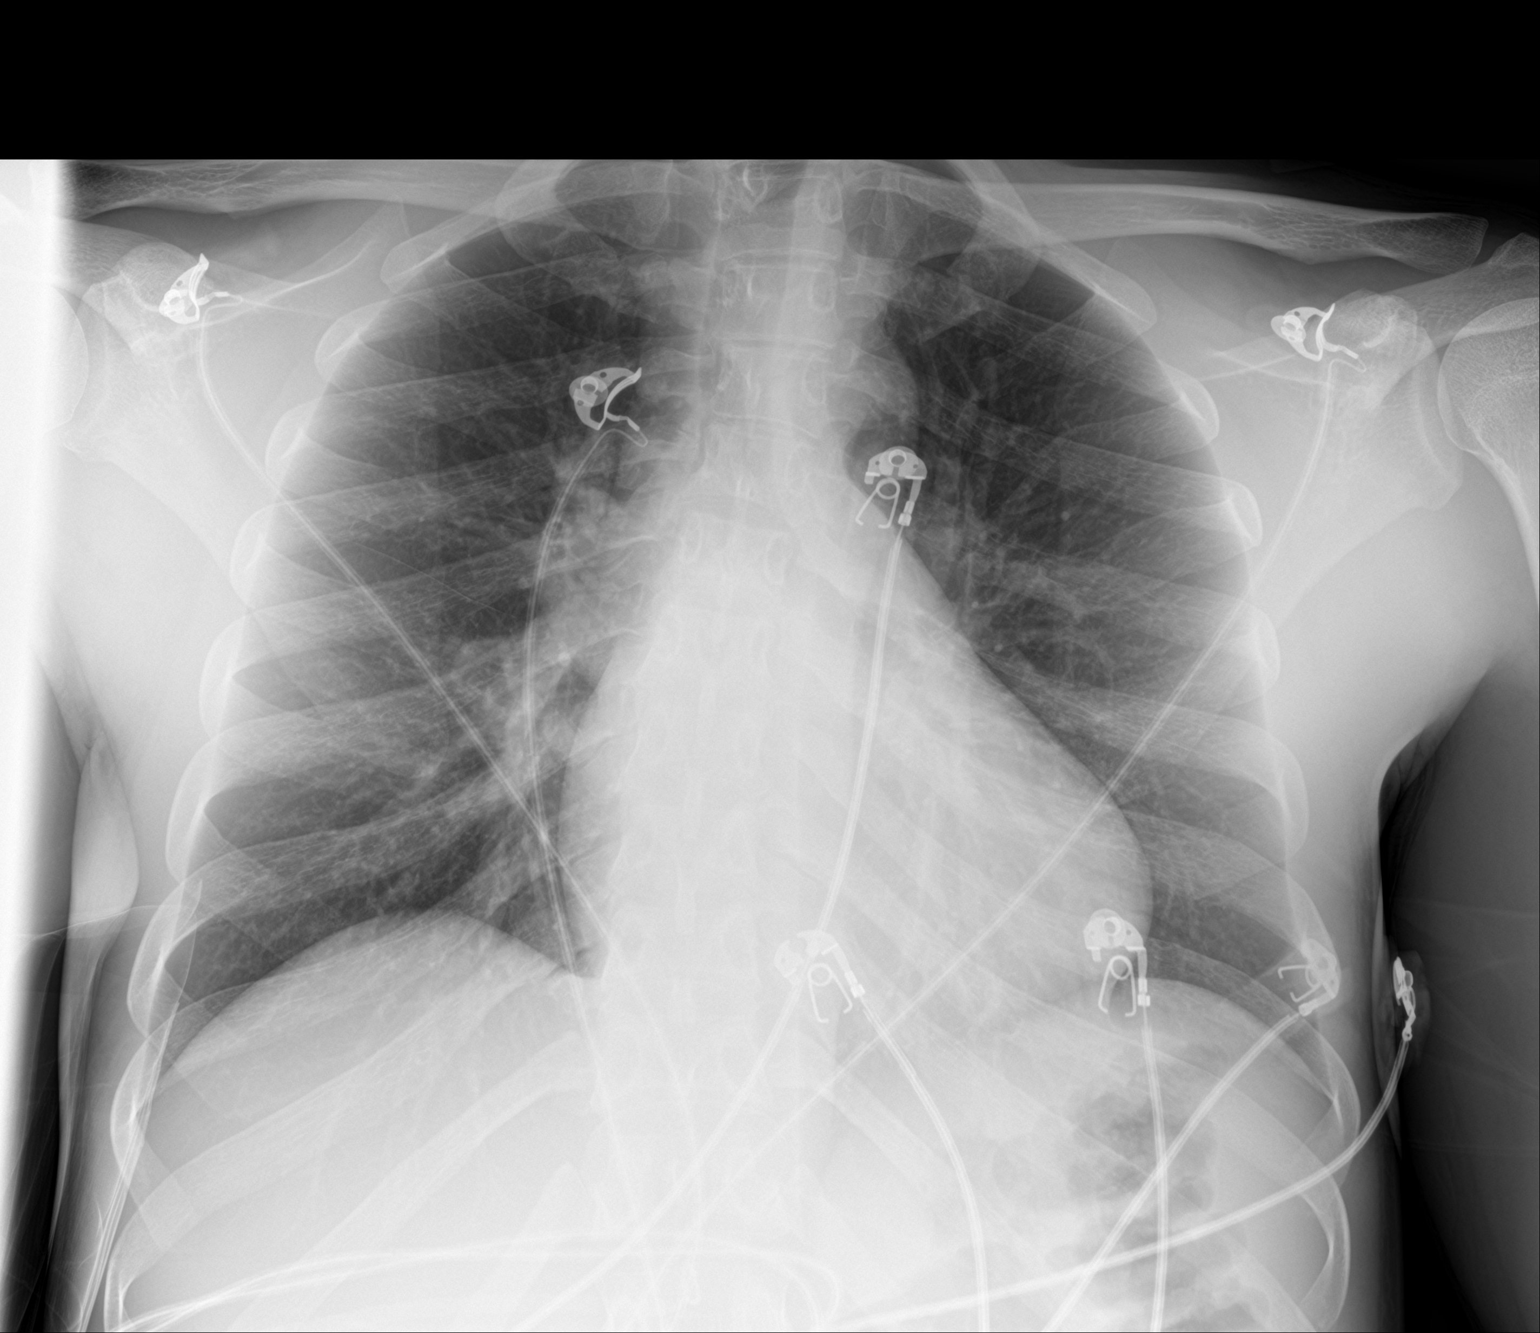

[2 of 2 positions shown; findings below may reference images not displayed]

FINDINGS: Heart size and mediastinal contours are within normal limits. Lungs
are clear. No pleural effusion or pneumothorax seen. Osseous
structures about the chest are unremarkable.
IMPRESSION: No active cardiopulmonary disease. No evidence of pneumonia or
pulmonary edema.
